# Patient Record
Sex: Female | Born: 1980 | Race: Black or African American | Hispanic: No | Marital: Single | Smoking: Former smoker
Health system: Southern US, Community
[De-identification: ages and names within clinical notes are randomized; demographics above are authoritative.]

## PROBLEM LIST (undated history)

## (undated) DIAGNOSIS — E039 Hypothyroidism, unspecified: Secondary | ICD-10-CM

## (undated) DIAGNOSIS — D649 Anemia, unspecified: Secondary | ICD-10-CM

## (undated) DIAGNOSIS — K219 Gastro-esophageal reflux disease without esophagitis: Secondary | ICD-10-CM

## (undated) DIAGNOSIS — Z21 Asymptomatic human immunodeficiency virus [HIV] infection status: Secondary | ICD-10-CM

## (undated) HISTORY — PX: FOOT SURGERY: SHX648

## (undated) HISTORY — PX: IUD REMOVAL: SHX5392

## (undated) HISTORY — PX: ECTOPIC PREGNANCY SURGERY: SHX613

---

## 2005-03-11 ENCOUNTER — Emergency Department: Payer: Self-pay | Admitting: General Practice

## 2006-06-17 ENCOUNTER — Emergency Department: Payer: Self-pay | Admitting: Emergency Medicine

## 2006-06-21 ENCOUNTER — Emergency Department: Payer: Self-pay | Admitting: Emergency Medicine

## 2006-06-22 ENCOUNTER — Emergency Department: Payer: Self-pay | Admitting: Internal Medicine

## 2006-06-24 ENCOUNTER — Emergency Department: Payer: Self-pay | Admitting: Emergency Medicine

## 2006-06-25 ENCOUNTER — Emergency Department: Payer: Self-pay | Admitting: Unknown Physician Specialty

## 2006-07-22 ENCOUNTER — Emergency Department: Payer: Self-pay | Admitting: Emergency Medicine

## 2007-09-05 ENCOUNTER — Ambulatory Visit: Payer: Self-pay | Admitting: Obstetrics and Gynecology

## 2007-09-06 ENCOUNTER — Inpatient Hospital Stay: Payer: Self-pay | Admitting: Obstetrics and Gynecology

## 2011-03-11 ENCOUNTER — Ambulatory Visit: Payer: Self-pay | Admitting: Obstetrics and Gynecology

## 2011-03-15 ENCOUNTER — Ambulatory Visit: Payer: Self-pay | Admitting: Obstetrics and Gynecology

## 2011-05-08 ENCOUNTER — Emergency Department: Payer: Self-pay | Admitting: Unknown Physician Specialty

## 2011-11-07 ENCOUNTER — Ambulatory Visit: Payer: Self-pay | Admitting: Family Medicine

## 2012-01-17 ENCOUNTER — Ambulatory Visit: Payer: Self-pay | Admitting: Family Medicine

## 2012-01-20 ENCOUNTER — Ambulatory Visit: Payer: Self-pay | Admitting: Family Medicine

## 2012-02-17 ENCOUNTER — Ambulatory Visit: Payer: Self-pay | Admitting: Family Medicine

## 2012-03-22 ENCOUNTER — Ambulatory Visit: Payer: Self-pay | Admitting: Family Medicine

## 2012-03-26 ENCOUNTER — Inpatient Hospital Stay: Payer: Self-pay

## 2012-03-26 LAB — CBC WITH DIFFERENTIAL/PLATELET
Basophil %: 0.2 %
Eosinophil #: 0 10*3/uL (ref 0.0–0.7)
HGB: 11.7 g/dL — ABNORMAL LOW (ref 12.0–16.0)
Lymphocyte #: 2.8 10*3/uL (ref 1.0–3.6)
Lymphocyte %: 28.8 %
MCV: 85 fL (ref 80–100)
Monocyte %: 11.4 %
Neutrophil #: 5.8 10*3/uL (ref 1.4–6.5)
Neutrophil %: 59.2 %
RBC: 4.36 10*6/uL (ref 3.80–5.20)
RDW: 16.5 % — ABNORMAL HIGH (ref 11.5–14.5)

## 2012-03-27 LAB — HEMATOCRIT: HCT: 32.7 % — ABNORMAL LOW (ref 35.0–47.0)

## 2013-07-28 ENCOUNTER — Emergency Department: Payer: Self-pay | Admitting: Internal Medicine

## 2013-07-28 LAB — COMPREHENSIVE METABOLIC PANEL
Albumin: 4 g/dL (ref 3.4–5.0)
Anion Gap: 8 (ref 7–16)
Bilirubin,Total: 0.4 mg/dL (ref 0.2–1.0)
Chloride: 105 mmol/L (ref 98–107)
Co2: 27 mmol/L (ref 21–32)
EGFR (African American): >60
EGFR (Non-African Amer.): >60
Glucose: 96 mg/dL (ref 65–99)
Osmolality: 278 (ref 275–301)
SGPT (ALT): 21 U/L (ref 12–78)
Sodium: 140 mmol/L (ref 136–145)
Total Protein: 8.1 g/dL (ref 6.4–8.2)

## 2013-07-28 LAB — URINALYSIS, COMPLETE
Bacteria: NONE SEEN
Glucose,UR: NEGATIVE mg/dL (ref 0–75)
Ketone: NEGATIVE
Protein: NEGATIVE
RBC,UR: 1 /HPF (ref 0–5)

## 2013-07-28 LAB — CBC
HCT: 40.3 % (ref 35.0–47.0)
HGB: 13.5 g/dL (ref 12.0–16.0)
MCHC: 33.5 g/dL (ref 32.0–36.0)
MCV: 82 fL (ref 80–100)
Platelet: 332 10*3/uL (ref 150–440)
RBC: 4.94 10*6/uL (ref 3.80–5.20)
RDW: 13.9 % (ref 11.5–14.5)
WBC: 5.8 10*3/uL (ref 3.6–11.0)

## 2013-07-28 LAB — PROTIME-INR: INR: 0.9

## 2014-02-22 ENCOUNTER — Emergency Department: Payer: Self-pay | Admitting: Internal Medicine

## 2014-03-04 ENCOUNTER — Emergency Department: Payer: Self-pay | Admitting: Emergency Medicine

## 2014-03-30 ENCOUNTER — Emergency Department: Payer: Self-pay | Admitting: Emergency Medicine

## 2014-03-30 LAB — COMPREHENSIVE METABOLIC PANEL
ALBUMIN: 4 g/dL (ref 3.4–5.0)
ALK PHOS: 74 U/L
ALT: 22 U/L (ref 12–78)
ANION GAP: 9 (ref 7–16)
BUN: 9 mg/dL (ref 7–18)
Bilirubin,Total: 0.3 mg/dL (ref 0.2–1.0)
Calcium, Total: 8.8 mg/dL (ref 8.5–10.1)
Chloride: 110 mmol/L — ABNORMAL HIGH (ref 98–107)
Co2: 21 mmol/L (ref 21–32)
Creatinine: 0.83 mg/dL (ref 0.60–1.30)
EGFR (African American): >60
EGFR (Non-African Amer.): >60
Glucose: 117 mg/dL — ABNORMAL HIGH (ref 65–99)
Osmolality: 279 (ref 275–301)
POTASSIUM: 3.7 mmol/L (ref 3.5–5.1)
SGOT(AST): 23 U/L (ref 15–37)
SODIUM: 140 mmol/L (ref 136–145)
TOTAL PROTEIN: 7.8 g/dL (ref 6.4–8.2)

## 2014-03-30 LAB — URINALYSIS, COMPLETE
Bilirubin,UR: NEGATIVE
Glucose,UR: NEGATIVE mg/dL (ref 0–75)
Ketone: NEGATIVE
NITRITE: NEGATIVE
PH: 9 (ref 4.5–8.0)
Protein: 100
RBC,UR: 9 /HPF (ref 0–5)
Specific Gravity: 1.025 (ref 1.003–1.030)
Squamous Epithelial: 15
WBC UR: 2 /HPF (ref 0–5)

## 2014-03-30 LAB — CBC
HCT: 40.7 % (ref 35.0–47.0)
HGB: 12.8 g/dL (ref 12.0–16.0)
MCH: 26.2 pg (ref 26.0–34.0)
MCHC: 31.4 g/dL — ABNORMAL LOW (ref 32.0–36.0)
MCV: 84 fL (ref 80–100)
PLATELETS: 337 10*3/uL (ref 150–440)
RBC: 4.87 10*6/uL (ref 3.80–5.20)
RDW: 15 % — AB (ref 11.5–14.5)
WBC: 5.5 10*3/uL (ref 3.6–11.0)

## 2014-03-30 LAB — LIPASE, BLOOD: Lipase: 90 U/L (ref 73–393)

## 2014-04-20 ENCOUNTER — Emergency Department: Payer: Self-pay | Admitting: Emergency Medicine

## 2014-04-20 LAB — COMPREHENSIVE METABOLIC PANEL
ALBUMIN: 4.1 g/dL (ref 3.4–5.0)
ANION GAP: 6 — AB (ref 7–16)
Alkaline Phosphatase: 72 U/L
BILIRUBIN TOTAL: 0.5 mg/dL (ref 0.2–1.0)
BUN: 10 mg/dL (ref 7–18)
CHLORIDE: 108 mmol/L — AB (ref 98–107)
Calcium, Total: 9.4 mg/dL (ref 8.5–10.1)
Co2: 25 mmol/L (ref 21–32)
Creatinine: 0.76 mg/dL (ref 0.60–1.30)
EGFR (African American): >60
Glucose: 95 mg/dL (ref 65–99)
Osmolality: 276 (ref 275–301)
Potassium: 3.8 mmol/L (ref 3.5–5.1)
SGOT(AST): 21 U/L (ref 15–37)
SGPT (ALT): 23 U/L (ref 12–78)
Sodium: 139 mmol/L (ref 136–145)
Total Protein: 8.2 g/dL (ref 6.4–8.2)

## 2014-04-20 LAB — URINALYSIS, COMPLETE
Bilirubin,UR: NEGATIVE
Blood: NEGATIVE
Glucose,UR: NEGATIVE mg/dL (ref 0–75)
KETONE: NEGATIVE
LEUKOCYTE ESTERASE: NEGATIVE
Nitrite: NEGATIVE
Ph: 8 (ref 4.5–8.0)
Protein: 30
RBC,UR: 3 /HPF (ref 0–5)
Specific Gravity: 1.021 (ref 1.003–1.030)
Squamous Epithelial: 8
WBC UR: 2 /HPF (ref 0–5)

## 2014-04-20 LAB — CBC WITH DIFFERENTIAL/PLATELET
BASOS PCT: 1.2 %
Basophil #: 0.1 10*3/uL (ref 0.0–0.1)
EOS PCT: 0.4 %
Eosinophil #: 0 10*3/uL (ref 0.0–0.7)
HCT: 39.7 % (ref 35.0–47.0)
HGB: 12.7 g/dL (ref 12.0–16.0)
LYMPHS PCT: 29.7 %
Lymphocyte #: 2.4 10*3/uL (ref 1.0–3.6)
MCH: 27.2 pg (ref 26.0–34.0)
MCHC: 32 g/dL (ref 32.0–36.0)
MCV: 85 fL (ref 80–100)
Monocyte #: 0.5 x10 3/mm (ref 0.2–0.9)
Monocyte %: 6 %
NEUTROS PCT: 62.7 %
Neutrophil #: 5.1 10*3/uL (ref 1.4–6.5)
PLATELETS: 333 10*3/uL (ref 150–440)
RBC: 4.67 10*6/uL (ref 3.80–5.20)
RDW: 14.8 % — AB (ref 11.5–14.5)
WBC: 8.1 10*3/uL (ref 3.6–11.0)

## 2014-04-20 LAB — LIPASE, BLOOD: Lipase: 88 U/L (ref 73–393)

## 2014-09-19 ENCOUNTER — Emergency Department: Payer: Self-pay | Admitting: Emergency Medicine

## 2014-09-19 LAB — CBC
HCT: 40.5 % (ref 35.0–47.0)
HGB: 12.6 g/dL (ref 12.0–16.0)
MCH: 26.6 pg (ref 26.0–34.0)
MCHC: 31 g/dL — AB (ref 32.0–36.0)
MCV: 86 fL (ref 80–100)
Platelet: 325 10*3/uL (ref 150–440)
RBC: 4.72 10*6/uL (ref 3.80–5.20)
RDW: 14.1 % (ref 11.5–14.5)
WBC: 6.1 10*3/uL (ref 3.6–11.0)

## 2014-09-19 LAB — BASIC METABOLIC PANEL
ANION GAP: 3 — AB (ref 7–16)
BUN: 8 mg/dL (ref 7–18)
CHLORIDE: 110 mmol/L — AB (ref 98–107)
CO2: 27 mmol/L (ref 21–32)
CREATININE: 0.86 mg/dL (ref 0.60–1.30)
Calcium, Total: 8.8 mg/dL (ref 8.5–10.1)
EGFR (African American): >60
Glucose: 72 mg/dL (ref 65–99)
OSMOLALITY: 276 (ref 275–301)
POTASSIUM: 4 mmol/L (ref 3.5–5.1)
Sodium: 140 mmol/L (ref 136–145)

## 2014-10-23 DIAGNOSIS — E049 Nontoxic goiter, unspecified: Secondary | ICD-10-CM | POA: Insufficient documentation

## 2014-10-23 DIAGNOSIS — I889 Nonspecific lymphadenitis, unspecified: Secondary | ICD-10-CM | POA: Insufficient documentation

## 2014-10-23 DIAGNOSIS — R9389 Abnormal findings on diagnostic imaging of other specified body structures: Secondary | ICD-10-CM | POA: Insufficient documentation

## 2014-11-03 ENCOUNTER — Ambulatory Visit: Payer: Self-pay | Admitting: Unknown Physician Specialty

## 2015-03-09 ENCOUNTER — Emergency Department: Payer: Self-pay | Admitting: Emergency Medicine

## 2015-04-12 NOTE — Op Note (Signed)
PATIENT NAME:  [name], Becky R MR#:  [id number] DATE OF BIRTH:  [birth year]  DATE OF PROCEDURE:  03/26/2012  PREOPERATIVE DIAGNOSES:  1. Intrauterine pregnancy, term.  2. Previous cesarean sections x2, desires repeat cesarean section.   POSTOPERATIVE DIAGNOSES:  1. Intrauterine pregnancy, term.  2. Previous cesarean sections x2, desires repeat cesarean section.   PROCEDURE PERFORMED: Low transverse cesarean section   SURGEON: Deloris PingPhilip J. Luella Cookosenow, MD  FIRST ASSISTANT: Farrel Connersolleen Gutierrez, CNM  NEONATOLOGIST: Dr. Ailene RavelEric Horowitz  OPERATIVE FINDINGS: 7 pound, 14 ounce female infant delivered at 16:54-Zayden.    DESCRIPTION OF PROCEDURE: After adequate conduction anesthesia, patient prepped and draped in routine fashion. A skin incision in modified Pfannenstiel fashion was made through the previous scar and carried down the various layers. Peritoneal cavity was entered. It should be noted that much scarring was encountered. Bladder flap was created although bladder really could be not pushed down. Low transverse incision made and the above-described infant was delivered without difficulty. Placenta was removed manually. Uterus then closed in a continuous lock suture of chromic 1. Several additional sutures were carried for hemostasis. Pelvis was lavaged with copious amounts of saline. Interceed was placed in the lower uterine segment. Rectus muscles reapproximated in the midline. Several additional sutures required for hemostasis. Fascia reapproximated with two continuous sutures of Maxon. It should be noted the On-Q pump was already placed. Multiple 3-0 plain sutures placed in the fat and the skin was closed with skin staples. Estimated blood loss 700 mL. Patient tolerated procedure well, left the Operating Room in good condition.     Sponge and needle counts were said to be correct at end of the procedure.   ____________________________ Deloris PingPhilip J. Luella Cookosenow, MD pjr:cms D: 03/26/2012 17:34:06  ET T: 03/27/2012 08:54:04 ET JOB#: [id number]  cc: Deloris PingPhilip J. Luella Cookosenow, MD, <Dictator> Towana BadgerPHILIP J Ravon Mortellaro MD ELECTRONICALLY SIGNED 04/10/2012 18:54

## 2015-04-13 LAB — SURGICAL PATHOLOGY

## 2015-05-25 ENCOUNTER — Encounter
Admission: RE | Admit: 2015-05-25 | Discharge: 2015-05-25 | Disposition: A | Discharge status: Home / Self Care | Payer: Medicaid Other | Source: Ambulatory Visit | Attending: Podiatry | Admitting: Podiatry

## 2015-05-25 DIAGNOSIS — M2012 Hallux valgus (acquired), left foot: Secondary | ICD-10-CM | POA: Insufficient documentation

## 2015-05-25 DIAGNOSIS — Z01812 Encounter for preprocedural laboratory examination: Secondary | ICD-10-CM | POA: Insufficient documentation

## 2015-05-25 LAB — DIFFERENTIAL
BASOS ABS: 0.1 10*3/uL (ref 0–0.1)
Basophils Relative: 1 %
EOS ABS: 0.1 10*3/uL (ref 0–0.7)
Eosinophils Relative: 2 %
LYMPHS PCT: 49 %
Lymphs Abs: 2.9 10*3/uL (ref 1.0–3.6)
Monocytes Absolute: 0.4 10*3/uL (ref 0.2–0.9)
Monocytes Relative: 7 %
Neutro Abs: 2.4 10*3/uL (ref 1.4–6.5)
Neutrophils Relative %: 41 %

## 2015-05-25 LAB — CBC
HEMATOCRIT: 35.9 % (ref 35.0–47.0)
Hemoglobin: 11.4 g/dL — ABNORMAL LOW (ref 12.0–16.0)
MCH: 27 pg (ref 26.0–34.0)
MCHC: 31.6 g/dL — AB (ref 32.0–36.0)
MCV: 85.3 fL (ref 80.0–100.0)
Platelets: 286 10*3/uL (ref 150–440)
RBC: 4.21 MIL/uL (ref 3.80–5.20)
RDW: 14.3 % (ref 11.5–14.5)
WBC: 6 10*3/uL (ref 3.6–11.0)

## 2015-05-25 NOTE — Patient Instructions (Signed)
  Your procedure is scheduled on:May 29, 2015 (Friday)Report to Same Day Surgery. To find out your arrival time please call [phone] between 1PM - 3PM on June 9,2016 Remember: Instructions that are not followed completely may result in serious medical risk, up to and including death, or upon the discretion of your surgeon and anesthesiologist your surgery may need to be rescheduled.    __x__ 1. Do not eat food or drink liquids after midnight. No gum chewing or hard candies.     __x__ 2. No Alcohol for 24 hours before or after surgery.   ____ 3. Bring all medications with you on the day of surgery if instructed.    __x__ 4. Notify your doctor if there is any change in your medical condition     (cold, fever, infections).     Do not wear jewelry, make-up, hairpins, clips or nail polish.  Do not wear lotions, powders, or perfumes. You may wear deodorant.  Do not shave 48 hours prior to surgery. Men may shave face and neck.  Do not bring valuables to the hospital.    [facility] is not [facility] for any belongings or valuables.               Contacts, dentures or bridgework may not be worn into surgery.  Leave your suitcase in the car. After surgery it may be brought to your room.  For patients admitted to the hospital, discharge time is determined by your                treatment team.   Patients discharged the day of surgery will not be allowed to drive home.   Please read over the following fact sheets that you were given:   Surgical Site Infection Prevention   ____ Take these medicines the morning of surgery with A SIP OF WATER:    1.   2.   3.   4.  5.  6.  ____ Fleet Enema (as directed)   __x__ Use CHG Soap as directed  ____ Use inhalers on the day of surgery  ____ Stop metformin 2 days prior to surgery    ____ Take 1/2 of usual insulin dose the night before surgery and none on the morning of surgery.   ____ Stop Coumadin/Plavix/aspirin on   ____ Stop  Anti-inflammatories on    ____ Stop supplements until after surgery.    ____ Bring C-Pap to the hospital.

## 2015-05-25 NOTE — Pre-Procedure Instructions (Signed)
  Your procedure is scheduled on: May 29, 2015 (Friday) Report to Same Day Surgery. To find out your arrival time please call [phone] between 1PM - 3PM on May 28, 2015 (Thursday).  Remember: Instructions that are not followed completely may result in serious medical risk, up to and including death, or upon the discretion of your surgeon and anesthesiologist your surgery may need to be rescheduled.    __x__ 1. Do not eat food or drink liquids after midnight. No gum chewing or hard candies.     __x__ 2. No Alcohol for 24 hours before or after surgery.   ____ 3. Bring all medications with you on the day of surgery if instructed.    __x__ 4. Notify your doctor if there is any change in your medical condition     (cold, fever, infections).     Do not wear jewelry, make-up, hairpins, clips or nail polish.  Do not wear lotions, powders, or perfumes. You may wear deodorant.  Do not shave 48 hours prior to surgery. Men may shave face and neck.  Do not bring valuables to the hospital.    [facility] is not responsible for any belongings or valuables.               Contacts, dentures or bridgework may not be worn into surgery.  Leave your suitcase in the car. After surgery it may be brought to your room.  For patients admitted to the hospital, discharge time is determined by your                treatment team.   Patients discharged the day of surgery will not be allowed to drive home.   Please read over the following fact sheets that you were given:   Surgical Site Infection Prevention   ____ Take these medicines the morning of surgery with A SIP OF WATER:    1.          ____ Fleet Enema (as directed)   __x__ Use CHG Soap as directed  ____ Use inhalers on the day of surgery  ____ Stop metformin 2 days prior to surgery    ____ Take 1/2 of usual insulin dose the night before surgery and none on the morning of surgery.   ____ Stop Coumadin/Plavix/aspirin on   ____ Stop  Anti-inflammatories on    ____ Stop supplements until after surgery.    ____ Bring C-Pap to the hospital.

## 2015-05-29 ENCOUNTER — Ambulatory Visit: Payer: Medicaid Other | Admitting: Registered Nurse

## 2015-05-29 ENCOUNTER — Ambulatory Visit
Admission: RE | Admit: 2015-05-29 | Discharge: 2015-05-29 | Disposition: A | Discharge status: Home / Self Care | Payer: Medicaid Other | Source: Ambulatory Visit | Attending: Podiatry | Admitting: Podiatry

## 2015-05-29 ENCOUNTER — Encounter
Admission: RE | Disposition: A | Discharge status: Home / Self Care | Payer: Self-pay | Source: Ambulatory Visit | Attending: Podiatry

## 2015-05-29 DIAGNOSIS — M2012 Hallux valgus (acquired), left foot: Secondary | ICD-10-CM | POA: Insufficient documentation

## 2015-05-29 DIAGNOSIS — E049 Nontoxic goiter, unspecified: Secondary | ICD-10-CM | POA: Insufficient documentation

## 2015-05-29 DIAGNOSIS — Z79899 Other long term (current) drug therapy: Secondary | ICD-10-CM | POA: Diagnosis not present

## 2015-05-29 HISTORY — PX: HALLUX VALGUS AUSTIN: SHX6623

## 2015-05-29 SURGERY — CORRECTION, HALLUX VALGUS
Anesthesia: Monitor Anesthesia Care | Laterality: Left | Wound class: Clean

## 2015-05-29 MED ORDER — FAMOTIDINE 20 MG PO TABS
20.0000 mg | ORAL_TABLET | Freq: Once | ORAL | Status: AC
  Administered 2015-05-29: 20 mg via ORAL

## 2015-05-29 MED ORDER — LACTATED RINGERS IV SOLN
INTRAVENOUS | Status: DC
  Administered 2015-05-29: 06:00:00 via INTRAVENOUS

## 2015-05-29 MED ORDER — MIDAZOLAM HCL 2 MG/2ML IJ SOLN
INTRAMUSCULAR | Status: DC | PRN
  Administered 2015-05-29: 2 mg via INTRAVENOUS

## 2015-05-29 MED ORDER — NEOMYCIN-POLYMYXIN B GU 40-200000 IR SOLN
Status: AC
  Filled 2015-05-29: qty 2

## 2015-05-29 MED ORDER — BUPIVACAINE HCL (PF) 0.5 % IJ SOLN
INTRAMUSCULAR | Status: DC | PRN
  Administered 2015-05-29: 10 mL

## 2015-05-29 MED ORDER — LEVOTHYROXINE SODIUM 25 MCG PO TABS
25.0000 ug | ORAL_TABLET | Freq: Every day | ORAL | Status: DC
  Filled 2015-05-29: qty 1

## 2015-05-29 MED ORDER — ADULT MULTIVITAMIN W/MINERALS CH
1.0000 | ORAL_TABLET | Freq: Every day | ORAL | Status: DC
  Filled 2015-05-29 (×2): qty 1

## 2015-05-29 MED ORDER — CEFAZOLIN SODIUM-DEXTROSE 2-3 GM-% IV SOLR
INTRAVENOUS | Status: DC | PRN
  Administered 2015-05-29: 2 g via INTRAVENOUS

## 2015-05-29 MED ORDER — ONDANSETRON HCL 4 MG/2ML IJ SOLN
4.0000 mg | Freq: Once | INTRAMUSCULAR | Status: DC | PRN
Start: 2015-05-29 — End: 2015-05-29

## 2015-05-29 MED ORDER — CEFAZOLIN SODIUM-DEXTROSE 2-3 GM-% IV SOLR
INTRAVENOUS | Status: AC
  Filled 2015-05-29: qty 50

## 2015-05-29 MED ORDER — BUPIVACAINE HCL (PF) 0.5 % IJ SOLN
INTRAMUSCULAR | Status: AC
  Filled 2015-05-29: qty 30

## 2015-05-29 MED ORDER — CEFAZOLIN SODIUM-DEXTROSE 2-3 GM-% IV SOLR
2.0000 g | Freq: Once | INTRAVENOUS | Status: AC
  Administered 2015-05-29: 2 g via INTRAVENOUS

## 2015-05-29 MED ORDER — FENTANYL CITRATE (PF) 100 MCG/2ML IJ SOLN
25.0000 ug | INTRAMUSCULAR | Status: DC | PRN
Start: 2015-05-29 — End: 2015-05-29

## 2015-05-29 MED ORDER — PROPOFOL INFUSION 10 MG/ML OPTIME
INTRAVENOUS | Status: DC | PRN
  Administered 2015-05-29: 75 ug/kg/min via INTRAVENOUS

## 2015-05-29 MED ORDER — FENTANYL CITRATE (PF) 100 MCG/2ML IJ SOLN
INTRAMUSCULAR | Status: DC | PRN
  Administered 2015-05-29 (×2): 50 ug via INTRAVENOUS

## 2015-05-29 MED ORDER — LIDOCAINE HCL (PF) 1 % IJ SOLN
INTRAMUSCULAR | Status: AC
  Filled 2015-05-29: qty 30

## 2015-05-29 MED ORDER — LIDOCAINE HCL (PF) 1 % IJ SOLN
INTRAMUSCULAR | Status: AC
  Administered 2015-05-29: 0.25 mL
  Filled 2015-05-29: qty 2

## 2015-05-29 MED ORDER — OXYCODONE-ACETAMINOPHEN 5-325 MG PO TABS: 1.0000 | ORAL_TABLET | ORAL | Status: DC | PRN

## 2015-05-29 MED ORDER — FAMOTIDINE 20 MG PO TABS
ORAL_TABLET | ORAL | Status: AC
  Filled 2015-05-29: qty 1

## 2015-05-29 SURGICAL SUPPLY — 59 items
BAG COUNTER SPONGE EZ (MISCELLANEOUS) IMPLANT
BANDAGE CONFORM 2X5YD N/S (GAUZE/BANDAGES/DRESSINGS) ×3 IMPLANT
BANDAGE ELASTIC 4 CLIP NS LF (GAUZE/BANDAGES/DRESSINGS) ×3 IMPLANT
BANDAGE STRETCH 3X4.1 STRL (GAUZE/BANDAGES/DRESSINGS) ×6 IMPLANT
BEAVER BLADE ×3 IMPLANT
BENZOIN TINCTURE PRP APPL 2/3 (GAUZE/BANDAGES/DRESSINGS) ×3 IMPLANT
BIT DRILL CANN 3.0 (BIT) ×3 IMPLANT
BIT DRILL TWST CANN 2.2X1.87MM (DRILL) ×2 IMPLANT
BLADE MED AGGRESSIVE (BLADE) ×3 IMPLANT
BLADE OSC/SAGITTAL MD 5.5X18 (BLADE) IMPLANT
BLADE SURG 15 STRL LF DISP TIS (BLADE) ×2 IMPLANT
BLADE SURG 15 STRL SS (BLADE) ×4
BNDG ESMARK 4X12 TAN STRL LF (GAUZE/BANDAGES/DRESSINGS) ×3 IMPLANT
BNDG STRETCH 4X75 STRL LF (GAUZE/BANDAGES/DRESSINGS) ×3 IMPLANT
CANISTER SUCT 1200ML W/VALVE (MISCELLANEOUS) ×3 IMPLANT
CLOSURE WOUND 1/4X4 (GAUZE/BANDAGES/DRESSINGS) ×1
COUNTER SPONGE BAG EZ (MISCELLANEOUS)
CUFF TOURN 18 STER (MISCELLANEOUS) IMPLANT
CUFF TOURN DUAL PL 12 NO SLV (MISCELLANEOUS) ×3 IMPLANT
DRAPE FLUOR MINI C-ARM 54X84 (DRAPES) ×3 IMPLANT
DRILL TWIST CANN 2.2X1.87MM (DRILL) ×6
DRSG TELFA 3X8 NADH (GAUZE/BANDAGES/DRESSINGS) ×3 IMPLANT
DURAPREP 26ML APPLICATOR (WOUND CARE) ×3 IMPLANT
GAUZE PETRO XEROFOAM 1X8 (MISCELLANEOUS) ×3 IMPLANT
GAUZE SPONGE 4X4 12PLY STRL (GAUZE/BANDAGES/DRESSINGS) ×3 IMPLANT
GLOVE BIO SURGEON STRL SZ7.5 (GLOVE) ×9 IMPLANT
GLOVE INDICATOR 8.0 STRL GRN (GLOVE) ×3 IMPLANT
GOWN STRL REUS W/ TWL LRG LVL3 (GOWN DISPOSABLE) ×2 IMPLANT
GOWN STRL REUS W/TWL LRG LVL3 (GOWN DISPOSABLE) ×4
K-WIRE TROCAR .8X100 (WIRE) ×3 IMPLANT
LABEL OR SOLS (LABEL) ×3 IMPLANT
MEDARTIS ×7 IMPLANT
NDL SAFETY 25GX1.5 (NEEDLE) ×3 IMPLANT
NEEDLE FILTER BLUNT 18X 1/2SAF (NEEDLE) ×2
NEEDLE FILTER BLUNT 18X1 1/2 (NEEDLE) ×1 IMPLANT
NS IRRIG 500ML POUR BTL (IV SOLUTION) ×3 IMPLANT
PACK EXTREMITY ARMC (MISCELLANEOUS) ×3 IMPLANT
PAD CAST CTTN 4X4 STRL (SOFTGOODS) ×1 IMPLANT
PAD GROUND ADULT SPLIT (MISCELLANEOUS) ×3 IMPLANT
PADDING CAST COTTON 4X4 STRL (SOFTGOODS) ×2
PENCIL ELECTRO HAND CTR (MISCELLANEOUS) ×3 IMPLANT
RASP SM TEAR CROSS CUT (RASP) ×3 IMPLANT
SCREW CANN 2.2X17 (Screw) ×3 IMPLANT
SOL PREP PVP 2OZ (MISCELLANEOUS) ×3
SOLUTION PREP PVP 2OZ (MISCELLANEOUS) ×1 IMPLANT
SPLINT FAST PLASTER 5X30 (CAST SUPPLIES) ×2
SPLINT PLASTER CAST FAST 5X30 (CAST SUPPLIES) ×1 IMPLANT
STOCKINETTE STRL 4IN 9604848 (GAUZE/BANDAGES/DRESSINGS) ×3 IMPLANT
STOCKINETTE STRL 6IN 960660 (GAUZE/BANDAGES/DRESSINGS) ×3 IMPLANT
STRAP SAFETY BODY (MISCELLANEOUS) ×3 IMPLANT
STRIP CLOSURE SKIN 1/4X4 (GAUZE/BANDAGES/DRESSINGS) ×2 IMPLANT
SUT ETHILON 5-0 FS-2 18 BLK (SUTURE) ×3 IMPLANT
SUT VIC AB 4-0 FS2 27 (SUTURE) ×3 IMPLANT
SWABSTK COMLB BENZOIN TINCTURE (MISCELLANEOUS) ×3 IMPLANT
SYRINGE 10CC LL (SYRINGE) ×3 IMPLANT
WIRE K 1.1 (MISCELLANEOUS) ×3 IMPLANT
WIRE Z .045 C-WIRE SPADE TIP (WIRE) IMPLANT
WIRE Z .062 C-WIRE SPADE TIP (WIRE) IMPLANT
k-wire ×6 IMPLANT

## 2015-05-29 NOTE — Anesthesia Postprocedure Evaluation (Signed)
  Anesthesia Post-op Note  Patient: Becky Manning  Procedure(s) Performed: Procedure(s): Hallux valgus correction, left foot  (Left)  Anesthesia type:MAC  Patient location: PACU  Post pain: Pain level controlled  Post assessment: Post-op Vital signs reviewed, Patient's Cardiovascular Status Stable, Respiratory Function Stable, Patent Airway and No signs of Nausea or vomiting  Post vital signs: Reviewed and stable  Last Vitals:  Filed Vitals:   05/29/15 0928  BP: 126/78  Pulse: 49  Temp: 37.2 C  Resp: 14    Level of consciousness: awake, alert  and patient cooperative  Complications: No apparent anesthesia complications

## 2015-05-29 NOTE — Transfer of Care (Signed)
Immediate Anesthesia Transfer of Care Note  Patient: Becky Manning  Procedure(s) Performed: Procedure(s): Hallux valgus correction, left foot  (Left)  Patient Location: PACU    Anesthesia Type:General   Level of Consciousness: awake, alert  and oriented  Airway & Oxygen Therapy: Patient Spontanous Breathing and Patient connected to face mask oxygen  Post-op Assessment: Report given to RN, Post -op Vital signs reviewed and stable and Patient moving all extremities X 4   Post vital signs: Reviewed and stable  Last Vitals:  Filed Vitals:   05/29/15 0859  BP: 117/87  Pulse: 52  Temp: 36.9 C  Resp: 16    Complications: No apparent anesthesia complications

## 2015-05-29 NOTE — Discharge Instructions (Signed)
1. Elevate the left leg.  2. Keep the bandage clean, dry, and do not remove.  3. Sponge bathe only left leg.  4. Wear surgical shoe over the splint whenever walking or standing.  5. Take one pain pill, oxycodone, every 4 hours as needed for pain

## 2015-05-29 NOTE — Op Note (Signed)
Date of operation: 05/29/2015  Surgeon: Ricci Barker DPM  Preoperative diagnosis: Hallux valgus deformity left foot  Postoperative diagnosis: Same  Procedure: Austin-type hallux valgus correction with screw fixation left foot  Anesthesia: Local Mac  Hemostasis: Pneumatic tourniquet left ankle 250 mmHg.  Estimated blood loss: Minimal  Materials: One 2.2 mm Medartis screw, length 17 mm.  Pathology: None  Complications: None apparent.  Operative indications: This is a 34 year old female with a history of a painful hallux valgus deformity on her left foot. She elects for surgical correction of the deformity.  Operative procedure: Patient was taken to the operating room and placed on the table in the supine position. Following satisfactory sedation the left foot was anesthetized with 10 cc of 0.5% bupivacaine plain around the first metatarsal. A pneumatic tourniquet was applied at the level of the left ankle and the foot was prepped and draped in the usual sterile fashion. The foot was exsanguinated and the tourniquet was inflated to 250 mmHg.    Attention was then directed to the dorsomedial aspect of the left foot where an approximate 5 cm linear incision was made coursing proximal to distal over the first metatarsal and metatarsal phalangeal joint. The incision was deepened via sharp and blunt dissection with care taken to coagulate all bleeders and retract neurovascular structures. At the level of the capsule a linear capsular incision was made and the capsular and periosteal tissues reflected off of the medial and dorsal head of the first metatarsal. A medial prominence was noted and this was resected using a pneumatic saw as well as the dorsal prominence which was resected. Next a K wire was driven from medial to lateral as an axis guide and a V-shaped osteotomy was performed through the head of the first metatarsal and the capital fragment was freely mobilized. Attention was then directed  to the first interspace where the incision was deepened down to the level of the transverse intermetatarsal ligament which was incised. A lateral capsulotomy with freeing of the sesamoid apparatus was performed with the release of the abductor tendon. Attention was then directed back to the medial aspect of the incision where the capital fragment was mobilized laterally and fixated using a K wire from the Medartis screw set. Good reduction of the deformity was noted with wire placement and then a 17 mm screw was then inserted using standard technique. An intraoperative FluoroScan views revealed good correction of the deformity and alignment of the osteotomy. The medial eminence was then resected and the edges were rasped smooth. Wound was flushed with copious amounts of sterile saline and closed in layers using a 40 Vicryls running suture for all layers from capsular and periosteal tissue to deep and superficial subcutaneous to skin closure. Tincture of benzoin and Steri-Strips applied to the incision followed by a sterile bandage. Tourniquet was released and blood flow noted to return immediately to the left foot and all digits. A posterior splint was applied to the left lower extremity with the foot 90 relative to the leg. She tolerated the procedure and anesthesia well and was transported to the PACU with vital signs stable and in good condition.

## 2015-05-29 NOTE — Anesthesia Preprocedure Evaluation (Signed)
Anesthesia Evaluation  Patient identified by MRN, date of birth, ID band Patient awake    Reviewed: Allergy & Precautions, NPO status , Patient's Chart, lab work & pertinent test results  Airway Mallampati: I  TM Distance: >3 FB Neck ROM: Full    Dental  (+) Teeth Intact   Pulmonary former smoker,    Pulmonary exam normal       Cardiovascular Exercise Tolerance: Good Normal cardiovascular exam    Neuro/Psych    GI/Hepatic   Endo/Other    Renal/GU      Musculoskeletal   Abdominal Normal abdominal exam  (+)   Peds  Hematology   Anesthesia Other Findings   Reproductive/Obstetrics                             Anesthesia Physical Anesthesia Plan  ASA: I  Anesthesia Plan: MAC   Post-op Pain Management:    Induction:   Airway Management Planned: Simple Face Mask  Additional Equipment:   Intra-op Plan:   Post-operative Plan:   Informed Consent: I have reviewed the patients History and Physical, chart, labs and discussed the procedure including the risks, benefits and alternatives for the proposed anesthesia with the patient or authorized representative who has indicated his/her understanding and acceptance.     Plan Discussed with: CRNA  Anesthesia Plan Comments:         Anesthesia Quick Evaluation

## 2015-05-29 NOTE — H&P (Signed)
  H&P was reviewed in the chart. No interval change. Patient stable for surgery

## 2015-06-26 ENCOUNTER — Other Ambulatory Visit: Payer: Self-pay

## 2015-06-26 ENCOUNTER — Encounter: Payer: Self-pay | Admitting: Emergency Medicine

## 2015-06-26 ENCOUNTER — Emergency Department
Admission: EM | Admit: 2015-06-26 | Discharge: 2015-06-26 | Disposition: A | Discharge status: Home / Self Care | Payer: Medicaid Other | Attending: Emergency Medicine | Admitting: Emergency Medicine

## 2015-06-26 DIAGNOSIS — Z79899 Other long term (current) drug therapy: Secondary | ICD-10-CM | POA: Diagnosis not present

## 2015-06-26 DIAGNOSIS — R1013 Epigastric pain: Secondary | ICD-10-CM | POA: Diagnosis present

## 2015-06-26 DIAGNOSIS — Z87891 Personal history of nicotine dependence: Secondary | ICD-10-CM | POA: Diagnosis not present

## 2015-06-26 DIAGNOSIS — Z3202 Encounter for pregnancy test, result negative: Secondary | ICD-10-CM | POA: Insufficient documentation

## 2015-06-26 DIAGNOSIS — K2970 Gastritis, unspecified, without bleeding: Secondary | ICD-10-CM

## 2015-06-26 LAB — CBC WITH DIFFERENTIAL/PLATELET
BASOS ABS: 0.1 10*3/uL (ref 0–0.1)
Basophils Relative: 1 %
Eosinophils Absolute: 0.1 10*3/uL (ref 0–0.7)
Eosinophils Relative: 1 %
HCT: 38.2 % (ref 35.0–47.0)
Hemoglobin: 12.7 g/dL (ref 12.0–16.0)
LYMPHS PCT: 36 %
Lymphs Abs: 2.1 10*3/uL (ref 1.0–3.6)
MCH: 27.6 pg (ref 26.0–34.0)
MCHC: 33.2 g/dL (ref 32.0–36.0)
MCV: 83.2 fL (ref 80.0–100.0)
MONOS PCT: 6 %
Monocytes Absolute: 0.4 10*3/uL (ref 0.2–0.9)
Neutro Abs: 3.2 10*3/uL (ref 1.4–6.5)
Neutrophils Relative %: 56 %
Platelets: 381 10*3/uL (ref 150–440)
RBC: 4.6 MIL/uL (ref 3.80–5.20)
RDW: 13.9 % (ref 11.5–14.5)
WBC: 5.8 10*3/uL (ref 3.6–11.0)

## 2015-06-26 LAB — COMPREHENSIVE METABOLIC PANEL
ALBUMIN: 3.8 g/dL (ref 3.5–5.0)
ALT: 13 U/L — ABNORMAL LOW (ref 14–54)
ANION GAP: 6 (ref 5–15)
AST: 17 U/L (ref 15–41)
Alkaline Phosphatase: 62 U/L (ref 38–126)
BUN: 10 mg/dL (ref 6–20)
CHLORIDE: 108 mmol/L (ref 101–111)
CO2: 25 mmol/L (ref 22–32)
CREATININE: 0.88 mg/dL (ref 0.44–1.00)
Calcium: 8.8 mg/dL — ABNORMAL LOW (ref 8.9–10.3)
GFR calc Af Amer: >60 mL/min (ref >60)
GFR calc non Af Amer: >60 mL/min (ref >60)
Glucose, Bld: 104 mg/dL — ABNORMAL HIGH (ref 65–99)
Potassium: 3.6 mmol/L (ref 3.5–5.1)
SODIUM: 139 mmol/L (ref 135–145)
TOTAL PROTEIN: 6.7 g/dL (ref 6.5–8.1)
Total Bilirubin: 0.5 mg/dL (ref 0.3–1.2)

## 2015-06-26 LAB — URINALYSIS COMPLETE WITH MICROSCOPIC (ARMC ONLY)
Bilirubin Urine: NEGATIVE
Glucose, UA: NEGATIVE mg/dL
Hgb urine dipstick: NEGATIVE
Ketones, ur: NEGATIVE mg/dL
Leukocytes, UA: NEGATIVE
Nitrite: NEGATIVE
PH: 7 (ref 5.0–8.0)
PROTEIN: NEGATIVE mg/dL
SPECIFIC GRAVITY, URINE: 1.018 (ref 1.005–1.030)

## 2015-06-26 LAB — LIPASE, BLOOD: Lipase: 32 U/L (ref 22–51)

## 2015-06-26 LAB — TROPONIN I

## 2015-06-26 LAB — POCT PREGNANCY, URINE: Preg Test, Ur: NEGATIVE

## 2015-06-26 MED ORDER — PROMETHAZINE HCL 25 MG/ML IJ SOLN
25.0000 mg | Freq: Once | INTRAMUSCULAR | Status: AC
  Administered 2015-06-26: 25 mg via INTRAVENOUS
  Filled 2015-06-26: qty 1

## 2015-06-26 MED ORDER — PANTOPRAZOLE SODIUM 40 MG PO TBEC: 40.0000 mg | DELAYED_RELEASE_TABLET | Freq: Every day | ORAL | Status: DC

## 2015-06-26 MED ORDER — TRAMADOL HCL 50 MG PO TABS: 50.0000 mg | ORAL_TABLET | Freq: Four times a day (QID) | ORAL | Status: AC | PRN

## 2015-06-26 MED ORDER — GI COCKTAIL ~~LOC~~
30.0000 mL | Freq: Once | ORAL | Status: AC
  Administered 2015-06-26: 30 mL via ORAL
  Filled 2015-06-26: qty 30

## 2015-06-26 MED ORDER — SODIUM CHLORIDE 0.9 % IV BOLUS (SEPSIS)
1000.0000 mL | Freq: Once | INTRAVENOUS | Status: AC
  Administered 2015-06-26: 1000 mL via INTRAVENOUS

## 2015-06-26 NOTE — ED Provider Notes (Signed)
[facility] Emergency Department Provider Note  Time seen: 12:18 PM  I have reviewed the triage vital signs and the nursing notes.   HISTORY  Chief Complaint Abdominal Pain    HPI Becky Manning is a 34 y.o. female who presents the emergency department with 4 days of epigastric burning, and spitting up frothy. According to the patient for the past 4 days she has felt somewhat nauseated, has had occasional loose stool. She states she has been spitting up a frothy-type spit. She states it feels somewhat like gastric reflux, but has not been improved with Tums.  Describes her discomfort as mild, her nausea as moderate. Has not noticed any modifying factors. Patient denies any black or bloody stool or vomit, dysuria, or fever     No past medical history on file.  There are no active problems to display for this patient.   Past Surgical History  Procedure Laterality Date  . Cesarean section N/A     X 3  . Ectopic pregnancy surgery Left   . Hallux valgus austin Left 05/29/2015    Procedure: Hallux valgus correction, left foot ;  Surgeon: Linus Galas, MD;  Location: [facility] ORS;  Service: Podiatry;  Laterality: Left;    Current Outpatient Rx  Name  Route  Sig  Dispense  Refill  . levothyroxine (SYNTHROID, LEVOTHROID) 25 MCG tablet   Oral   Take 25 mcg by mouth daily before breakfast.         . Multiple Vitamin (MULTIVITAMIN WITH MINERALS) TABS tablet   Oral   Take 1 tablet by mouth daily.         Marland Kitchen oxyCODONE-acetaminophen (ROXICET) 5-325 MG per tablet   Oral   Take 1-2 tablets by mouth every 4 (four) hours as needed for moderate pain or severe pain.   30 tablet   0     Allergies Review of patient's allergies indicates no known allergies.  Family History  Problem Relation Age of Onset  . Hypertension Mother   . Hypercholesterolemia Mother     Social History History  Substance Use Topics  . Smoking status: Former Smoker -- 0.25 packs/day    Types: Cigarettes    Quit date: 05/19/2012  . Smokeless tobacco: Never Used  . Alcohol Use: Yes    Review of Systems Constitutional: Negative for fever. Cardiovascular: Negative for chest pain. Respiratory: Negative for shortness of breath. Gastrointestinal: Mild epigastric discomfort. Positive nausea. Negative for diarrhea. Genitourinary: Negative for dysuria. Musculoskeletal: Negative for back pain. 10-point ROS otherwise negative.  ____________________________________________   PHYSICAL EXAM:  VITAL SIGNS: ED Triage Vitals  Enc Vitals Group     BP 06/26/15 1137 156/93 mmHg     Pulse Rate 06/26/15 1137 71     Resp 06/26/15 1137 20     Temp 06/26/15 1137 98.4 F (36.9 C)     Temp Source 06/26/15 1137 Oral     SpO2 06/26/15 1137 100 %     Weight 06/26/15 1137 170 lb (77.111 kg)     Height 06/26/15 1137  (1.676 m)     Head Cir --      Peak Flow --      Pain Score 06/26/15 1142 4     Pain Loc --      Pain Edu? --      Excl. in GC? --     Constitutional: Alert and oriented. Well appearing and in no distress. ENT   Mouth/Throat: Mucous membranes are moist.  Cardiovascular: Normal rate, regular rhythm. No murmur Respiratory: Normal respiratory effort without tachypnea nor retractions. Breath sounds are clear and equal bilaterally. No wheezes/rales/rhonchi. Gastrointestinal: Soft and nontender. No distention.  Benign abdominal exam. Musculoskeletal: Nontender with normal range of motion in all extremities.  Neurologic:  Normal speech and language. No gross focal neurologic deficits  Skin:  Skin is warm, dry and intact.  Psychiatric: Mood and affect are normal. Speech and behavior are normal. ____________________________________________    EKG  EKG reviewed and interpreted by myself shows sinus bradycardia at 54 bpm, narrow QRS, normal axis, normal intervals, no ST changes noted.  ____________________________________________   INITIAL IMPRESSION /  ASSESSMENT AND PLAN / ED COURSE  Pertinent labs & imaging results that were available during my care of the patient were reviewed by me and considered in my medical decision making (see chart for details).  Patient presents with nausea, spitting up, mild epigastric discomfort. She states she has had a similar episode in the past diagnosed as gastritis. She does note she drank alcohol on July 4 which is when her symptoms started. Denies daily alcohol use.  Patient states complete resolution of discomfort and nausea following GI cocktail and Phenergan. Patient has seen Dr. Mechele CollinElliott in the past, she will follow-up with him for presumed gastritis. We'll discharge the patient on Protonix, Ultram, and Maalox as needed. I discussed this with the patient as well as strict return precautions to which the patient is agreeable.  ____________________________________________   FINAL CLINICAL IMPRESSION(S) / ED DIAGNOSES  Nausea Epigastric pain Gastritis   [name], MD 06/26/15 1422

## 2015-06-26 NOTE — Discharge Instructions (Signed)
Gastritis, Adult °Gastritis is soreness and puffiness (inflammation) of the lining of the stomach. If you do not get help, gastritis can cause bleeding and sores (ulcers) in the stomach. °HOME CARE  °· Only take medicine as told by your doctor. °· If you were given antibiotic medicines, take them as told. Finish the medicines even if you start to feel better. °· Drink enough fluids to keep your pee (urine) clear or pale yellow. °· Avoid foods and drinks that make your problems worse. Foods you may want to avoid include: °¨ Caffeine or alcohol. °¨ Chocolate. °¨ Mint. °¨ Garlic and onions. °¨ Spicy foods. °¨ Citrus fruits, including oranges, lemons, or limes. °¨ Food containing tomatoes, including sauce, chili, salsa, and pizza. °¨ Fried and fatty foods. °· Eat small meals throughout the day instead of large meals. °GET HELP RIGHT AWAY IF:  °· You have black or dark red poop (stools). °· You throw up (vomit) blood. It may look like coffee grounds. °· You cannot keep fluids down. °· Your belly (abdominal) pain gets worse. °· You have a fever. °· You do not feel better after 1 week. °· You have any other questions or concerns. °MAKE SURE YOU:  °· Understand these instructions. °· Will watch your condition. °· Will get help right away if you are not doing well or get worse. °Document Released: 05/23/2008 Document Revised: 02/27/2012 Document Reviewed: 01/18/2012 °Exit[employer]® Patient Information ©2015 [employer]. This information is not intended to replace advice given to you by your health care provider. Make sure you discuss any questions you have with your health care provider. ° °

## 2015-06-26 NOTE — ED Notes (Signed)
Pt states that she hasn't been feeling well since wed, states that she has been spitting up a foamy type of vomit, states that she feels like something is sitting at the top of her stomach

## 2015-06-26 NOTE — ED Notes (Signed)
Pt informed to return if any life threatening symptoms occur.  

## 2015-09-08 ENCOUNTER — Encounter: Payer: Self-pay | Admitting: *Deleted

## 2015-09-08 ENCOUNTER — Other Ambulatory Visit: Payer: Medicaid Other

## 2015-09-10 NOTE — Patient Instructions (Signed)
  Your procedure is scheduled on: 09-11-15 Report to [facility] SAME [facility] 2ND FLOOR To find out your arrival time please call [phone] between 1PM - 3PM on 09-10-15  Remember: Instructions that are not followed completely may result in serious medical risk, up to and including death, or upon the discretion of your surgeon and anesthesiologist your surgery may need to be rescheduled.    _X___ 1. Do not eat food or drink liquids after midnight. No gum chewing or hard candies.     _X___ 2. No Alcohol for 24 hours before or after surgery.   ____ 3. Bring all medications with you on the day of surgery if instructed.    ____ 4. Notify your doctor if there is any change in your medical condition     (cold, fever, infections).     Do not wear jewelry, make-up, hairpins, clips or nail polish.  Do not wear lotions, powders, or perfumes. You may wear deodorant.  Do not shave 48 hours prior to surgery. Men may shave face and neck.  Do not bring valuables to the hospital.    [facility] is not responsible for any belongings or valuables.               Contacts, dentures or bridgework may not be worn into surgery.  Leave your suitcase in the car. After surgery it may be brought to your room.  For patients admitted to the hospital, discharge time is determined by your  treatment team.   Patients discharged the day of surgery will not be allowed to drive home.   Please read over the following fact sheets that you were given:     ____ Take these medicines the morning of surgery with A SIP OF WATER:    1.LEVOTHYROXINE  2. PROTONIX  3. TAKE A PROTONIX Thursday NIGHT  4.  5.  6.  ____ Fleet Enema (as directed)   ____ Use CHG Soap as directed  ____ Use inhalers on the day of surgery  ____ Stop metformin 2 days prior to surgery    ____ Take 1/2 of usual insulin dose the night before surgery and none on the morning of surgery.   ____ Stop Coumadin/Plavix/aspirin-N/A  ____  Stop Anti-inflammatories-NO NSAIDS OR ASA PRODUCTS-TYLENOL OK   ____ Stop supplements until after surgery.    ____ Bring C-Pap to the hospital.

## 2015-09-11 ENCOUNTER — Ambulatory Visit: Payer: Medicaid Other | Admitting: Anesthesiology

## 2015-09-11 ENCOUNTER — Encounter
Admission: RE | Disposition: A | Discharge status: Home / Self Care | Payer: Self-pay | Source: Ambulatory Visit | Attending: Podiatry

## 2015-09-11 ENCOUNTER — Ambulatory Visit
Admission: RE | Admit: 2015-09-11 | Discharge: 2015-09-11 | Disposition: A | Discharge status: Home / Self Care | Payer: Medicaid Other | Source: Ambulatory Visit | Attending: Podiatry | Admitting: Podiatry

## 2015-09-11 DIAGNOSIS — Z8489 Family history of other specified conditions: Secondary | ICD-10-CM | POA: Insufficient documentation

## 2015-09-11 DIAGNOSIS — Z8249 Family history of ischemic heart disease and other diseases of the circulatory system: Secondary | ICD-10-CM | POA: Insufficient documentation

## 2015-09-11 DIAGNOSIS — Z9889 Other specified postprocedural states: Secondary | ICD-10-CM | POA: Insufficient documentation

## 2015-09-11 DIAGNOSIS — Z79899 Other long term (current) drug therapy: Secondary | ICD-10-CM | POA: Diagnosis not present

## 2015-09-11 DIAGNOSIS — M2011 Hallux valgus (acquired), right foot: Secondary | ICD-10-CM | POA: Diagnosis present

## 2015-09-11 HISTORY — DX: Hypothyroidism, unspecified: E03.9

## 2015-09-11 HISTORY — PX: HALLUX VALGUS AUSTIN: SHX6623

## 2015-09-11 HISTORY — DX: Anemia, unspecified: D64.9

## 2015-09-11 HISTORY — PX: AIKEN OSTEOTOMY: SHX6331

## 2015-09-11 HISTORY — DX: Gastro-esophageal reflux disease without esophagitis: K21.9

## 2015-09-11 LAB — DIFFERENTIAL
BASOS ABS: 0 10*3/uL (ref 0–0.1)
BASOS PCT: 0 %
EOS PCT: 4 %
Eosinophils Absolute: 0.2 10*3/uL (ref 0–0.7)
LYMPHS ABS: 2.3 10*3/uL (ref 1.0–3.6)
Lymphocytes Relative: 42 %
MONOS PCT: 8 %
Monocytes Absolute: 0.4 10*3/uL (ref 0.2–0.9)
NEUTROS PCT: 46 %
Neutro Abs: 2.4 10*3/uL (ref 1.4–6.5)

## 2015-09-11 LAB — CBC
HCT: 36.9 % (ref 35.0–47.0)
Hemoglobin: 11.7 g/dL — ABNORMAL LOW (ref 12.0–16.0)
MCH: 26.4 pg (ref 26.0–34.0)
MCHC: 31.7 g/dL — ABNORMAL LOW (ref 32.0–36.0)
MCV: 83.2 fL (ref 80.0–100.0)
Platelets: 305 10*3/uL (ref 150–440)
RBC: 4.43 MIL/uL (ref 3.80–5.20)
RDW: 13.6 % (ref 11.5–14.5)
WBC: 5.4 10*3/uL (ref 3.6–11.0)

## 2015-09-11 LAB — POCT PREGNANCY, URINE: PREG TEST UR: NEGATIVE

## 2015-09-11 SURGERY — CORRECTION, HALLUX VALGUS
Anesthesia: General | Site: Foot | Laterality: Right | Wound class: Clean

## 2015-09-11 MED ORDER — ACETAMINOPHEN 10 MG/ML IV SOLN
INTRAVENOUS | Status: AC
  Filled 2015-09-11: qty 100

## 2015-09-11 MED ORDER — LACTATED RINGERS IV SOLN
INTRAVENOUS | Status: DC
  Administered 2015-09-11: 07:00:00 via INTRAVENOUS

## 2015-09-11 MED ORDER — NEOMYCIN-POLYMYXIN B GU 40-200000 IR SOLN
Status: AC
  Filled 2015-09-11: qty 2

## 2015-09-11 MED ORDER — FENTANYL CITRATE (PF) 100 MCG/2ML IJ SOLN: 25.0000 ug | INTRAMUSCULAR | Status: DC | PRN

## 2015-09-11 MED ORDER — CEFAZOLIN SODIUM-DEXTROSE 2-3 GM-% IV SOLR
2.0000 g | Freq: Once | INTRAVENOUS | Status: AC
  Administered 2015-09-11: 2 g via INTRAVENOUS

## 2015-09-11 MED ORDER — OXYCODONE-ACETAMINOPHEN 5-325 MG PO TABS: 1.0000 | ORAL_TABLET | ORAL | Status: DC | PRN

## 2015-09-11 MED ORDER — FENTANYL CITRATE (PF) 100 MCG/2ML IJ SOLN
INTRAMUSCULAR | Status: DC | PRN
Start: 2015-09-11 — End: 2015-09-11
  Administered 2015-09-11 (×2): 50 ug via INTRAVENOUS

## 2015-09-11 MED ORDER — PROPOFOL 10 MG/ML IV BOLUS
INTRAVENOUS | Status: DC | PRN
  Administered 2015-09-11: 50 mg via INTRAVENOUS
  Administered 2015-09-11: 30 mg via INTRAVENOUS

## 2015-09-11 MED ORDER — KETOROLAC TROMETHAMINE 30 MG/ML IJ SOLN
INTRAMUSCULAR | Status: DC | PRN
  Administered 2015-09-11: 30 mg via INTRAVENOUS

## 2015-09-11 MED ORDER — MIDAZOLAM HCL 2 MG/2ML IJ SOLN
INTRAMUSCULAR | Status: DC | PRN
  Administered 2015-09-11: 2 mg via INTRAVENOUS

## 2015-09-11 MED ORDER — BUPIVACAINE HCL 0.5 % IJ SOLN
INTRAMUSCULAR | Status: DC | PRN
  Administered 2015-09-11: 10 mL

## 2015-09-11 MED ORDER — LIDOCAINE HCL (PF) 2 % IJ SOLN
INTRAMUSCULAR | Status: DC | PRN
  Administered 2015-09-11: 100 mg via INTRADERMAL

## 2015-09-11 MED ORDER — CEFAZOLIN SODIUM-DEXTROSE 2-3 GM-% IV SOLR
INTRAVENOUS | Status: AC
  Filled 2015-09-11: qty 50

## 2015-09-11 MED ORDER — NEOMYCIN-POLYMYXIN B GU 40-200000 IR SOLN
Status: DC | PRN
  Administered 2015-09-11: 2 mL

## 2015-09-11 MED ORDER — GLYCOPYRROLATE 0.2 MG/ML IJ SOLN
INTRAMUSCULAR | Status: DC | PRN
  Administered 2015-09-11: 0.2 mg via INTRAVENOUS

## 2015-09-11 MED ORDER — PROPOFOL 500 MG/50ML IV EMUL
INTRAVENOUS | Status: DC | PRN
  Administered 2015-09-11: 140 ug/kg/min via INTRAVENOUS

## 2015-09-11 MED ORDER — ONDANSETRON HCL 4 MG/2ML IJ SOLN
INTRAMUSCULAR | Status: DC | PRN
  Administered 2015-09-11: 4 mg via INTRAVENOUS

## 2015-09-11 MED ORDER — BUPIVACAINE HCL (PF) 0.5 % IJ SOLN
INTRAMUSCULAR | Status: AC
  Filled 2015-09-11: qty 30

## 2015-09-11 MED ORDER — ONDANSETRON HCL 4 MG/2ML IJ SOLN: 4.0000 mg | Freq: Once | INTRAMUSCULAR | Status: DC | PRN

## 2015-09-11 MED ORDER — ACETAMINOPHEN 10 MG/ML IV SOLN
INTRAVENOUS | Status: DC | PRN
  Administered 2015-09-11: 1000 mg via INTRAVENOUS

## 2015-09-11 MED ORDER — LIDOCAINE HCL (PF) 1 % IJ SOLN
INTRAMUSCULAR | Status: AC
  Filled 2015-09-11: qty 30

## 2015-09-11 SURGICAL SUPPLY — 56 items
BAG COUNTER SPONGE EZ (MISCELLANEOUS) ×2 IMPLANT
BANDAGE ELASTIC 4 CLIP NS LF (GAUZE/BANDAGES/DRESSINGS) ×2 IMPLANT
BANDAGE STRETCH 3X4.1 STRL (GAUZE/BANDAGES/DRESSINGS) ×4 IMPLANT
BLADE MED AGGRESSIVE (BLADE) ×2 IMPLANT
BLADE OSC/SAGITTAL 5.5X25 (BLADE) ×2 IMPLANT
BLADE OSC/SAGITTAL MD 5.5X18 (BLADE) ×2 IMPLANT
BLADE OSCILLATING/SAGITTAL (BLADE)
BLADE SURG 15 STRL LF DISP TIS (BLADE) IMPLANT
BLADE SURG 15 STRL SS (BLADE)
BLADE SURG MINI STRL (BLADE) IMPLANT
BLADE SW THK.38XMED LNG THN (BLADE) IMPLANT
BNDG ESMARK 4X12 TAN STRL LF (GAUZE/BANDAGES/DRESSINGS) IMPLANT
BNDG GAUZE 4.5X4.1 6PLY STRL (MISCELLANEOUS) IMPLANT
CANISTER SUCT 1200ML W/VALVE (MISCELLANEOUS) ×2 IMPLANT
CUFF TOURN 18 STER (MISCELLANEOUS) IMPLANT
CUFF TOURN DUAL PL 12 NO SLV (MISCELLANEOUS) ×2 IMPLANT
DRAPE FLUOR MINI C-ARM 54X84 (DRAPES) ×2 IMPLANT
DRSG TELFA 3X8 NADH (GAUZE/BANDAGES/DRESSINGS) IMPLANT
DURAPREP 26ML APPLICATOR (WOUND CARE) ×2 IMPLANT
GAUZE PETRO XEROFOAM 1X8 (MISCELLANEOUS) ×2 IMPLANT
GAUZE SPONGE 4X4 12PLY STRL (GAUZE/BANDAGES/DRESSINGS) ×2 IMPLANT
GAUZE STRETCH 2X75IN STRL (MISCELLANEOUS) IMPLANT
GLOVE BIO SURGEON STRL SZ7.5 (GLOVE) ×2 IMPLANT
GLOVE INDICATOR 8.0 STRL GRN (GLOVE) ×2 IMPLANT
GOWN STRL REUS W/ TWL LRG LVL3 (GOWN DISPOSABLE) ×2 IMPLANT
GOWN STRL REUS W/TWL LRG LVL3 (GOWN DISPOSABLE) ×2
LABEL OR SOLS (LABEL) ×2 IMPLANT
NEEDLE FILTER BLUNT 18X 1/2SAF (NEEDLE) ×1
NEEDLE FILTER BLUNT 18X1 1/2 (NEEDLE) ×1 IMPLANT
NEEDLE HYPO 25X1 1.5 SAFETY (NEEDLE) ×4 IMPLANT
NS IRRIG 1000ML POUR BTL (IV SOLUTION) ×2 IMPLANT
NS IRRIG 500ML POUR BTL (IV SOLUTION) ×2 IMPLANT
PACK EXTREMITY ARMC (MISCELLANEOUS) ×2 IMPLANT
PAD CAST CTTN 4X4 STRL (SOFTGOODS) IMPLANT
PAD GROUND ADULT SPLIT (MISCELLANEOUS) ×2 IMPLANT
PADDING CAST 2X4YD ST (MISCELLANEOUS)
PADDING CAST BLEND 2X4 STRL (MISCELLANEOUS) IMPLANT
PADDING CAST COTTON 4X4 STRL (SOFTGOODS)
PENCIL ELECTRO HAND CTR (MISCELLANEOUS) ×2 IMPLANT
RASP SM TEAR CROSS CUT (RASP) ×2 IMPLANT
SOL PREP PVP 2OZ (MISCELLANEOUS) ×2
SOLUTION PREP PVP 2OZ (MISCELLANEOUS) ×1 IMPLANT
SPLINT CAST 1 STEP 4X30 (MISCELLANEOUS) ×2 IMPLANT
SPLINT FAST PLASTER 5X30 (CAST SUPPLIES)
SPLINT PLASTER CAST FAST 5X30 (CAST SUPPLIES) IMPLANT
STAPLE NIT 15X12X10.9MMX1.5 (Staple) ×2 IMPLANT
STAPLE NIT SUPER 13X10X10 (Staple) ×2 IMPLANT
STAPLE SPR MET 9X9X9 1.5 (Staple) ×2 IMPLANT
STOCKINETTE STRL 6IN 960660 (GAUZE/BANDAGES/DRESSINGS) ×2 IMPLANT
STRAP SAFETY BODY (MISCELLANEOUS) ×2 IMPLANT
STRIP CLOSURE SKIN 1/4X4 (GAUZE/BANDAGES/DRESSINGS) ×2 IMPLANT
SUT VIC AB 4-0 FS2 27 (SUTURE) ×2 IMPLANT
SWABSTK COMLB BENZOIN TINCTURE (MISCELLANEOUS) ×2 IMPLANT
SYRINGE 10CC LL (SYRINGE) ×2 IMPLANT
WIRE Z .045 C-WIRE SPADE TIP (WIRE) IMPLANT
WIRE Z .062 C-WIRE SPADE TIP (WIRE) ×2 IMPLANT

## 2015-09-11 NOTE — Anesthesia Postprocedure Evaluation (Signed)
  Anesthesia Post-op Note  Patient: Becky Manning  Procedure(s) Performed: Procedure(s): HALLUX VALGUS AUSTIN (Right) AIKEN OSTEOTOMY (Right)  Anesthesia type:General  Patient location: PACU  Post pain: Pain level controlled  Post assessment: Post-op Vital signs reviewed, Patient's Cardiovascular Status Stable, Respiratory Function Stable, Patent Airway and No signs of Nausea or vomiting  Post vital signs: Reviewed and stable  Last Vitals:  Filed Vitals:   09/11/15 1020  BP: 119/68  Pulse: 65  Temp: 36.2 C  Resp: 16    Level of consciousness: awake, alert  and patient cooperative  Complications: No apparent anesthesia complications

## 2015-09-11 NOTE — Anesthesia Procedure Notes (Signed)
Date/Time: 09/11/2015 7:17 AM Performed by: Stormy Fabian Pre-anesthesia Checklist: Patient identified, Emergency Drugs available, Suction available and Patient being monitored Patient Re-evaluated:Patient Re-evaluated prior to inductionOxygen Delivery Method: Nasal cannula

## 2015-09-11 NOTE — H&P (Signed)
  History and physical by her medical doctor in the chart is reviewed. No interval change. Patient is stable for surgery

## 2015-09-11 NOTE — Interval H&P Note (Signed)
History and Physical Interval Note:  09/11/2015 7:13 AM  Becky Manning  has presented today for surgery, with the diagnosis of HALLUX VALGUS  The various methods of treatment have been discussed with the patient and family. After consideration of risks, benefits and other options for treatment, the patient has consented to  Procedure(s): HALLUX VALGUS AUSTIN (Right) AIKEN OSTEOTOMY (Right) as a surgical intervention .  The patient's history has been reviewed, patient examined, no change in status, stable for surgery.  I have reviewed the patient's chart and labs.  Questions were answered to the patient's satisfaction.     CLINE,TODD W.

## 2015-09-11 NOTE — Discharge Instructions (Addendum)
AMBULATORY SURGERY  DISCHARGE INSTRUCTIONS   1) The drugs that you were given will stay in your system until tomorrow so for the next 24 hours you should not:  A) Drive an automobile B) Make any legal decisions C) Drink any alcoholic beverage   2) You may resume regular meals tomorrow.  Today it is better to start with liquids and gradually work up to solid foods.  You may eat anything you prefer, but it is better to start with liquids, then soup and crackers, and gradually work up to solid foods.   3) Please notify your doctor immediately if you have any unusual bleeding, trouble breathing, redness and pain at the surgery site, drainage, fever, or pain not relieved by medication.    4) Additional Instructions:                   A. Elevate the right leg on 2 pillows.  B. Keep the bandage clean, dry, and do not remove..  C. Sponge bathe only the right lower extremity.  D. no weight on the right leg using crutches for nonweightbearing.  E. take one pain pill, Percocet, every 4 hours as needed for pain  Do not take TRAMADOL WHILE TAKING THE PERCOCET FOR PAIN....      Please contact your physician with any problems or Same Day Surgery at [phone], Monday through Friday 6 am to 4 pm, or [place] at [facility] number at [phone].

## 2015-09-11 NOTE — Anesthesia Preprocedure Evaluation (Signed)
Anesthesia Evaluation  Patient identified by MRN, date of birth, ID band Patient awake    Reviewed: Allergy & Precautions, NPO status , Patient's Chart, lab work & pertinent test results  History of Anesthesia Complications (+) history of anesthetic complications  Airway Mallampati: II       Dental  (+) Teeth Intact, Missing   Pulmonary former smoker (quit x 4 yrs),           Cardiovascular negative cardio ROS       Neuro/Psych negative neurological ROS     GI/Hepatic Neg liver ROS, GERD  Medicated,  Endo/Other  Hypothyroidism   Renal/GU negative Renal ROS     Musculoskeletal negative musculoskeletal ROS (+)   Abdominal   Peds  Hematology  (+) anemia ,   Anesthesia Other Findings   Reproductive/Obstetrics                             Anesthesia Physical Anesthesia Plan  ASA: II  Anesthesia Plan: General   Post-op Pain Management:    Induction: Intravenous  Airway Management Planned: Nasal Cannula  Additional Equipment:   Intra-op Plan:   Post-operative Plan:   Informed Consent: I have reviewed the patients History and Physical, chart, labs and discussed the procedure including the risks, benefits and alternatives for the proposed anesthesia with the patient or authorized representative who has indicated his/her understanding and acceptance.     Plan Discussed with:   Anesthesia Plan Comments:         Anesthesia Quick Evaluation

## 2015-09-11 NOTE — Addendum Note (Signed)
Addendum  created 09/11/15 1216 by Stormy Fabian, CRNA   Modules edited: Charges VN

## 2015-09-11 NOTE — Transfer of Care (Signed)
Immediate Anesthesia Transfer of Care Note  Patient: Becky Manning  Procedure(s) Performed: Procedure(s): HALLUX VALGUS AUSTIN (Right) Quintella Reichert OSTEOTOMY (Right)  Patient Location: PACU  Anesthesia Type:General  Level of Consciousness: sedated  Airway & Oxygen Therapy: Patient Spontanous Breathing and Patient connected to face mask oxygen  Post-op Assessment: Report given to RN and Post -op Vital signs reviewed and stable  Post vital signs: Reviewed and stable  Last Vitals:  Filed Vitals:   09/11/15 0943  BP: 119/81  Pulse: 98  Temp: 37.2 C  Resp: 18    Complications: No apparent anesthesia complications

## 2015-09-11 NOTE — Op Note (Signed)
Date of operation: 09/11/2015.  Surgeon: Ricci Barker DPM.  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  Postoperative diagnosis: Same.  Procedures: 1. Modified McBride bunionectomy right great toe joint. 2. Base wedge osteotomy right first metatarsal. 3. Akin osteotomy right hallux.  Anesthesia: Local Mac.  Hemostasis: Pneumatic tourniquet right ankle 250 mmHg.  Estimated blood loss: Minimal.  Materials: Nitinol bone staples in length of 15 mm, 13 mm, and 9 mm.  Complications: None apparent.  Operative indications: This is a 34 year old female with a history of a painful bunion on her right foot and she elects to have surgical correction as was previously performed on her left foot.  Operative procedure: The patient was taken to the operating room and placed on the table in the supine position. Following satisfactory sedation the right foot was anesthetized with 10 cc of 0.5% bupivacaine plain around the right midfoot and base of the first metatarsal. A pneumatic tourniquet was applied at the level of the right ankle and foot was prepped and draped in the usual sterile fashion. The foot was exsanguinated and the tourniquet inflated to 250 mmHg.   Attention was then directed to the dorsal aspect of the right foot where an approximate 5 cm curvilinear incision was made over the distal first metatarsal and metatarsophalangeal joint and onto the base of the great toe area and the incision was deepened via sharp and blunt dissection with care taken to retract and coagulate all bleeders and neurovascular structures. At the level of the joint a linear capsulotomy was performed and the capsular and periosteal tissues reflected off of the head of the first metatarsal. There was noted to be some significant cartilage loss along the medial groove of the metatarsal head with a bony prominence medial. Next using a bone saw the medial eminence was resected in  toto. There was still noted to be some contracture of the toe and at this point attention was directed towards the first interspace where the incision was deepened down to the lateral aspect of the joint where the transverse metatarsal ligament was incised. The abductor tendon was then transected off of the lateral aspect of the first metatarsal base and a fibular sesamoid release with lateral capsulotomy was performed which reduced the contracture in the great toe. The wound was then flushed with copious amounts of sterile saline.    Attention was then directed to the dorsal aspect of the foot where the incision was lengthened proximally over the base of the first metatarsal another 4 cm. The incision was then deepened down to the level of the bone with care taken to retract the extensor tendon and neurovascular structures. A linear periosteal incision was made and the periosteal tissues reflected off of the dorsal lateral and medial base of the first metatarsal. Using a bone saw a wedge osteotomy was taken out of the base of the first metatarsal with the apex medial and the base lateral. The wedge of bone was removed and then the osteotomy was reduced and feathered down until the first metatarsal was in a good rectus alignment with the remaining metatarsals. Intraoperative FluoroScan views revealed good reduction of the deformity. Next a 15 mm x 12 mm x 12 mm bone staple was then placed along the medial cortex of the first metatarsal across the osteotomy site. Next a 13 mm x 10 mm x 10 mm bone staple was then placed dorsal lateral and there was noted to be good reduction of the osteotomy  and alignment of the first ray. There was still noted to be some mild lateral deviation of the hallux within the great toe itself sewed the distal aspect of the incision was then lengthened another centimeter to reveal the base of the proximal phalanx. Periosteal incision was made at the base and the periosteum reflected off of  the dorsal medial aspect of the base of the proximal phalanx. An osteotomy was then performed through the medial aspect of the cortex with care taken to leave the lateral hinge intact. The osteotomy was feathered down until the toes and a good rectus alignment. Next a 9 mm x 9 mm x 9 mm bone staple was then placed across the osteotomy in the hallux. Intraoperative FluoroScan views revealed good reduction of all of the deformities and good placement of the staples. The wounds were then all flushed with copious amounts of sterile saline and closed in layers using 40 Vicryls running suture for all layers from periosteal and capsular closure to deep and superficial subcutaneous closure and then followed by skin closure. Tincture of benzoin and Steri-Strips applied to the incision followed by Xeroform and a sterile gauze bandage. The tourniquet was released and blood flow noted to return immediately to the right foot and all digits. Next a fiberglass posterior splint was then applied to the right lower extremity with the foot 90 relative to the leg. Patient tolerated the procedure and anesthesia well and was transported to the PACU with vital signs stable and in good condition.

## 2015-11-07 ENCOUNTER — Emergency Department: Payer: Medicaid Other

## 2015-11-07 ENCOUNTER — Emergency Department
Admission: EM | Admit: 2015-11-07 | Discharge: 2015-11-07 | Disposition: A | Discharge status: Home / Self Care | Payer: Medicaid Other | Attending: Emergency Medicine | Admitting: Emergency Medicine

## 2015-11-07 ENCOUNTER — Encounter: Payer: Self-pay | Admitting: Emergency Medicine

## 2015-11-07 DIAGNOSIS — Z79899 Other long term (current) drug therapy: Secondary | ICD-10-CM | POA: Diagnosis not present

## 2015-11-07 DIAGNOSIS — E860 Dehydration: Secondary | ICD-10-CM | POA: Diagnosis not present

## 2015-11-07 DIAGNOSIS — Z87891 Personal history of nicotine dependence: Secondary | ICD-10-CM | POA: Insufficient documentation

## 2015-11-07 DIAGNOSIS — R1030 Lower abdominal pain, unspecified: Secondary | ICD-10-CM | POA: Diagnosis not present

## 2015-11-07 DIAGNOSIS — R112 Nausea with vomiting, unspecified: Secondary | ICD-10-CM | POA: Diagnosis present

## 2015-11-07 LAB — URINALYSIS COMPLETE WITH MICROSCOPIC (ARMC ONLY)
BACTERIA UA: NONE SEEN
BILIRUBIN URINE: NEGATIVE
GLUCOSE, UA: 50 mg/dL — AB
HGB URINE DIPSTICK: NEGATIVE
Leukocytes, UA: NEGATIVE
Nitrite: NEGATIVE
Protein, ur: 30 mg/dL — AB
RBC / HPF: NONE SEEN RBC/hpf (ref 0–5)
SQUAMOUS EPITHELIAL / LPF: NONE SEEN
Specific Gravity, Urine: 1.024 (ref 1.005–1.030)
WBC UA: NONE SEEN WBC/hpf (ref 0–5)
pH: 7 (ref 5.0–8.0)

## 2015-11-07 LAB — CBC WITH DIFFERENTIAL/PLATELET
BASOS PCT: 1 %
Basophils Absolute: 0.1 10*3/uL (ref 0–0.1)
EOS ABS: 0 10*3/uL (ref 0–0.7)
Eosinophils Relative: 0 %
HEMATOCRIT: 41.1 % (ref 35.0–47.0)
HEMOGLOBIN: 13.3 g/dL (ref 12.0–16.0)
LYMPHS ABS: 1.9 10*3/uL (ref 1.0–3.6)
Lymphocytes Relative: 34 %
MCH: 27 pg (ref 26.0–34.0)
MCHC: 32.4 g/dL (ref 32.0–36.0)
MCV: 83.4 fL (ref 80.0–100.0)
MONOS PCT: 4 %
Monocytes Absolute: 0.3 10*3/uL (ref 0.2–0.9)
NEUTROS ABS: 3.5 10*3/uL (ref 1.4–6.5)
NEUTROS PCT: 61 %
Platelets: 327 10*3/uL (ref 150–440)
RBC: 4.93 MIL/uL (ref 3.80–5.20)
RDW: 14 % (ref 11.5–14.5)
WBC: 5.7 10*3/uL (ref 3.6–11.0)

## 2015-11-07 LAB — COMPREHENSIVE METABOLIC PANEL
ALBUMIN: 4.5 g/dL (ref 3.5–5.0)
ALK PHOS: 71 U/L (ref 38–126)
ALT: 19 U/L (ref 14–54)
ANION GAP: 13 (ref 5–15)
AST: 32 U/L (ref 15–41)
BILIRUBIN TOTAL: 0.8 mg/dL (ref 0.3–1.2)
BUN: 8 mg/dL (ref 6–20)
CALCIUM: 9.8 mg/dL (ref 8.9–10.3)
CO2: 17 mmol/L — AB (ref 22–32)
CREATININE: 0.82 mg/dL (ref 0.44–1.00)
Chloride: 111 mmol/L (ref 101–111)
GFR calc Af Amer: >60 mL/min (ref >60)
GFR calc non Af Amer: >60 mL/min (ref >60)
GLUCOSE: 122 mg/dL — AB (ref 65–99)
Potassium: 4 mmol/L (ref 3.5–5.1)
SODIUM: 141 mmol/L (ref 135–145)
Total Protein: 7.8 g/dL (ref 6.5–8.1)

## 2015-11-07 LAB — POCT PREGNANCY, URINE: PREG TEST UR: NEGATIVE

## 2015-11-07 LAB — LIPASE, BLOOD: Lipase: 22 U/L (ref 11–51)

## 2015-11-07 MED ORDER — ONDANSETRON HCL 4 MG PO TABS: 4.0000 mg | ORAL_TABLET | Freq: Three times a day (TID) | ORAL | Status: DC | PRN

## 2015-11-07 MED ORDER — ONDANSETRON HCL 4 MG/2ML IJ SOLN
4.0000 mg | Freq: Once | INTRAMUSCULAR | Status: AC
  Administered 2015-11-07: 4 mg via INTRAVENOUS
  Filled 2015-11-07: qty 2

## 2015-11-07 MED ORDER — LORAZEPAM 2 MG/ML IJ SOLN
1.0000 mg | Freq: Once | INTRAMUSCULAR | Status: AC
  Administered 2015-11-07: 1 mg via INTRAVENOUS
  Filled 2015-11-07: qty 1

## 2015-11-07 MED ORDER — SODIUM CHLORIDE 0.9 % IV SOLN
1000.0000 mL | Freq: Once | INTRAVENOUS | Status: AC
  Administered 2015-11-07: 1000 mL via INTRAVENOUS

## 2015-11-07 NOTE — ED Notes (Signed)
Patient transported to X-ray 

## 2015-11-07 NOTE — ED Notes (Signed)
Pt reports being dehydrated; states had a "couple of drinks last night" and nausea/vomiting ("too many") since this morning

## 2015-11-07 NOTE — Discharge Instructions (Signed)
Dehydration, Adult °Dehydration is a condition in which you do not have enough fluid or water in your body. It happens when you take in less fluid than you lose. Vital organs such as the kidneys, brain, and heart cannot function without a proper amount of fluids. Any loss of fluids from the body can cause dehydration.  °Dehydration can range from mild to severe. This condition should be treated right away to help prevent it from becoming severe. °CAUSES  °This condition may be caused by: °· Vomiting. °· Diarrhea. °· Excessive sweating, such as when exercising in hot or humid weather. °· Not drinking enough fluid during strenuous exercise or during an illness. °· Excessive urine output. °· Fever. °· Certain medicines. °RISK FACTORS °This condition is more likely to develop in: °· People who are taking certain medicines that cause the body to lose excess fluid (diuretics).   °· People who have a chronic illness, such as diabetes, that may increase urination. °· Older adults.   °· People who live at high altitudes.   °· People who participate in endurance sports.   °SYMPTOMS  °Mild Dehydration °· Thirst. °· Dry lips. °· Slightly dry mouth. °· Dry, warm skin. °Moderate Dehydration °· Very dry mouth.   °· Muscle cramps.   °· Dark urine and decreased urine production.   °· Decreased tear production.   °· Headache.   °· Light-headedness, especially when you stand up from a sitting position.   °Severe Dehydration °· Changes in skin.   °¨ Cold and clammy skin.   °¨ Skin does not spring back quickly when lightly pinched and released.   °· Changes in body fluids.   °¨ Extreme thirst.   °¨ No tears.   °¨ Not able to sweat when body temperature is high, such as in hot weather.   °¨ Minimal urine production.   °· Changes in vital signs.   °¨ Rapid, weak pulse (more than 100 beats per minute when you are sitting still).   °¨ Rapid breathing.   °¨ Low blood pressure.   °· Other changes.   °¨ Sunken eyes.   °¨ Cold hands and feet.    °¨ Confusion. °¨ Lethargy and difficulty being awakened. °¨ Fainting (syncope).   °¨ Short-term weight loss.   °¨ Unconsciousness. °DIAGNOSIS  °This condition may be diagnosed based on your symptoms. You may also have tests to determine how severe your dehydration is. These tests may include:  °· Urine tests.   °· Blood tests.   °TREATMENT  °Treatment for this condition depends on the severity. Mild or moderate dehydration can often be treated at home. Treatment should be started right away. Do not wait until dehydration becomes severe. Severe dehydration needs to be treated at the hospital. °Treatment for Mild Dehydration °· Drinking plenty of water to replace the fluid you have lost.   °· Replacing minerals in your blood (electrolytes) that you may have lost.   °Treatment for Moderate Dehydration  °· Consuming oral rehydration solution (ORS). °Treatment for Severe Dehydration °· Receiving fluid through an IV tube.   °· Receiving electrolyte solution through a feeding tube that is passed through your nose and into your stomach (nasogastric tube or NG tube). °· Correcting any abnormalities in electrolytes. °HOME CARE INSTRUCTIONS  °· Drink enough fluid to keep your urine clear or pale yellow.   °· Drink water or fluid slowly by taking small sips. You can also try sucking on ice cubes.  °· Have food or beverages that contain electrolytes. Examples include bananas and sports drinks. °· Take over-the-counter and prescription medicines only as told by your health care provider.   °· Prepare ORS according to the manufacturer's instructions. Take sips   of ORS every 5 minutes until your urine returns to normal.  If you have vomiting or diarrhea, continue to try to drink water, ORS, or both.   If you have diarrhea, avoid:   Beverages that contain caffeine.   Fruit juice.   Milk.   Carbonated soft drinks.  Do not take salt tablets. This can lead to the condition of having too much sodium in your body  (hypernatremia).  SEEK MEDICAL CARE IF:  You cannot eat or drink without vomiting.  You have had moderate diarrhea during a period of more than 24 hours.  You have a fever. SEEK IMMEDIATE MEDICAL CARE IF:   You have extreme thirst.  You have severe diarrhea.  You have not urinated in 6-8 hours, or you have urinated only a small amount of very dark urine.  You have shriveled skin.  You are dizzy, confused, or both.   This information is not intended to replace advice given to you by your health care provider. Make sure you discuss any questions you have with your health care provider.   Document Released: 12/05/2005 Document Revised: 08/26/2015 Document Reviewed: 04/22/2015 Elsevier Interactive Patient Education [employer]2016 [employer].  Please return immediately if condition worsens. Please contact her primary physician or the physician you were given for referral. If you have any specialist physicians involved in her treatment and plan please also contact them. Thank you for using [facility]. Return especially for fever, bloody diarrhea, bloody vomit, focal abdominal pain or any other new concerns.

## 2015-11-07 NOTE — ED Provider Notes (Signed)
Time Seen: Approximately 2030 I have reviewed the triage notes  Chief Complaint: Emesis and Nausea   History of Present Illness: Becky Manning is a 34 y.o. female who presents with some persistent feelings of nausea and vomited starting last night 5-6 times over the last 24 hours. She states she still feels nauseated. She denies any fever or focal abdominal pain. She denies any loose stool or diarrhea. She states she only had 2 drinks last night. Her symptoms seemed to start afterwards. She denies any dysuria, hematuria, urinary frequency. She denies any vaginal discharge or bleeding. She denies any hematemesis or biliary emesis.   Past Medical History  Diagnosis Date  . GERD (gastroesophageal reflux disease)   . Anemia     H/O  . Hypothyroidism     There are no active problems to display for this patient.   Past Surgical History  Procedure Laterality Date  . Cesarean section N/A     X 3  . Ectopic pregnancy surgery Left   . Hallux valgus austin Left 05/29/2015    Procedure: Hallux valgus correction, left foot ;  Surgeon: Linus Galas, MD;  Location: [facility] ORS;  Service: Podiatry;  Laterality: Left;  . Foot surgery    . Iud removal    . Hallux valgus austin Right 09/11/2015    Procedure: HALLUX VALGUS AUSTIN;  Surgeon: Linus Galas, MD;  Location: [facility] ORS;  Service: Podiatry;  Laterality: Right;  . Aiken osteotomy Right 09/11/2015    Procedure: Quintella Reichert OSTEOTOMY;  Surgeon: Linus Galas, MD;  Location: [facility] ORS;  Service: Podiatry;  Laterality: Right;    Past Surgical History  Procedure Laterality Date  . Cesarean section N/A     X 3  . Ectopic pregnancy surgery Left   . Hallux valgus austin Left 05/29/2015    Procedure: Hallux valgus correction, left foot ;  Surgeon: Linus Galas, MD;  Location: [facility] ORS;  Service: Podiatry;  Laterality: Left;  . Foot surgery    . Iud removal    . Hallux valgus austin Right 09/11/2015    Procedure: HALLUX VALGUS AUSTIN;  Surgeon: Linus Galas, MD;   Location: [facility] ORS;  Service: Podiatry;  Laterality: Right;  . Aiken osteotomy Right 09/11/2015    Procedure: Quintella Reichert OSTEOTOMY;  Surgeon: Linus Galas, MD;  Location: [facility] ORS;  Service: Podiatry;  Laterality: Right;    Current Outpatient Rx  Name  Route  Sig  Dispense  Refill  . levothyroxine (SYNTHROID, LEVOTHROID) 25 MCG tablet   Oral   Take 25 mcg by mouth daily before breakfast.         . medroxyPROGESTERone (DEPO-SUBQ PROVERA 104) 104 MG/0.65ML injection   Subcutaneous   Inject 104 mg into the skin every 3 (three) months.         . Multiple Vitamin (MULTIVITAMIN WITH MINERALS) TABS tablet   Oral   Take 1 tablet by mouth daily.         . ondansetron (ZOFRAN) 4 MG tablet   Oral   Take 1 tablet (4 mg total) by mouth every 8 (eight) hours as needed for nausea or vomiting.   21 tablet   0   . oxyCODONE-acetaminophen (ROXICET) 5-325 MG per tablet   Oral   Take 1-2 tablets by mouth every 4 (four) hours as needed for moderate pain or severe pain.   30 tablet   0   . pantoprazole (PROTONIX) 40 MG tablet   Oral   Take 1 tablet (40 mg total)  by mouth daily. Patient taking differently: Take 40 mg by mouth every morning.    30 tablet   1   . traMADol (ULTRAM) 50 MG tablet   Oral   Take 1 tablet (50 mg total) by mouth every 6 (six) hours as needed. Patient not taking: Reported on 09/08/2015   20 tablet   0     Allergies:  Review of patient's allergies indicates no known allergies.  Family History: Family History  Problem Relation Age of Onset  . Hypertension Mother   . Hypercholesterolemia Mother     Social History: Social History  Substance Use Topics  . Smoking status: Former Smoker -- 0.25 packs/day    Types: Cigarettes    Quit date: 05/19/2012  . Smokeless tobacco: Never Used  . Alcohol Use: No     Review of Systems:   10 point review of systems was performed and was otherwise negative:  Constitutional: No fever Eyes: No visual disturbances ENT:  No sore throat, ear pain Cardiac: No chest pain Respiratory: No shortness of breath, wheezing, or stridor Abdomen: She states. Mild abdominal pain over the lower middle abdominal region, persistent vomiting No diarrhea Endocrine: No weight loss, No night sweats Extremities: No peripheral edema, cyanosis Skin: No rashes, easy bruising Neurologic: No focal weakness, trouble with speech or swollowing Urologic: No dysuria, Hematuria, or urinary frequency *  Physical Exam:  ED Triage Vitals  Enc Vitals Group     BP 11/07/15 2033 129/86 mmHg     Pulse Rate 11/07/15 2033 82     Resp 11/07/15 2033 24     Temp 11/07/15 2039 97.5 F (36.4 C)     Temp Source 11/07/15 2039 Oral     SpO2 11/07/15 2033 100 %     Weight 11/07/15 2033 166 lb (75.297 kg)     Height 11/07/15 2033 5\' 5"  (1.651 m)     Head Cir --      Peak Flow --      Pain Score 11/07/15 2220 7     Pain Loc --      Pain Edu? --      Excl. in GC? --     General: Awake , Alert , and Oriented times 3; GCS 15 patient's very anxious Head: Normal cephalic , atraumatic Eyes: Pupils equal , round, reactive to light Nose/Throat: No nasal drainage, patent upper airway without erythema or exudate.  Neck: Supple, Full range of motion, No anterior adenopathy or palpable thyroid masses Lungs: Clear to ascultation without wheezes , rhonchi, or rales Heart: Regular rate, regular rhythm without murmurs , gallops , or rubs Abdomen: Soft, non tender without rebound, guarding , or rigidity; bowel sounds positive and symmetric in all 4 quadrants. No organomegaly .        Extremities: 2 plus symmetric pulses. No edema, clubbing or cyanosis Neurologic: normal ambulation, Motor symmetric without deficits, sensory intact Skin: warm, dry, no rashes   Labs:   All laboratory work was reviewed including any pertinent negatives or positives listed below:  Labs Reviewed  COMPREHENSIVE METABOLIC PANEL - Abnormal; Notable for the following:    CO2 17  (*)    Glucose, Bld 122 (*)    All other components within normal limits  URINALYSIS COMPLETEWITH MICROSCOPIC ([facility] ONLY) - Abnormal; Notable for the following:    Color, Urine YELLOW (*)    APPearance CLEAR (*)    Glucose, UA 50 (*)    Ketones, ur 1+ (*)    Protein,  ur 30 (*)    All other components within normal limits  CBC WITH DIFFERENTIAL/PLATELET  LIPASE, BLOOD  POC URINE PREG, ED  POCT PREGNANCY, URINE   pregnancy test was negative and laboratory work was reviewed with no significant findings  EKG:  ED ECG REPORT I, Jennye Moccasin, the attending physician, personally viewed and interpreted this ECG.  Date: 11/07/2015 EKG Time: *2039 Rate: 70 Rhythm: normal sinus rhythm QRS Axis: normal Intervals: normal ST/T Wave abnormalities: normal Conduction Disutrbances: none Narrative Interpretation: unremarkable      ED Course: Patient received IV fluids along with Zofran and a dose of Ativan. Patient had symptomatic relief and is able to tolerate oral fluids here in emergency department. She is hemodynamically stable and doesn't appear to have any significant electrolyte abnormality seen her laboratory work.*Reexamination of the patient's abdomen showed no peritoneal signs and I felt this was unlikely to be a presentation for a surgical abdomen    Assessment:  Acute nausea and vomiting Mild dehydration   Final Clinical Impression:   Final diagnoses:  Dehydration     Plan:  Outpatient management Patient was advised to return immediately if condition worsens. Patient was advised to follow up with her primary care physician or other specialized physicians involved and in their current assessment.            Jennye Moccasin, MD 11/07/15 [phone]

## 2016-09-27 ENCOUNTER — Emergency Department
Admission: EM | Admit: 2016-09-27 | Discharge: 2016-09-27 | Disposition: A | Discharge status: Home / Self Care | Payer: No Typology Code available for payment source | Attending: Emergency Medicine | Admitting: Emergency Medicine

## 2016-09-27 ENCOUNTER — Emergency Department: Payer: No Typology Code available for payment source

## 2016-09-27 DIAGNOSIS — E039 Hypothyroidism, unspecified: Secondary | ICD-10-CM | POA: Insufficient documentation

## 2016-09-27 DIAGNOSIS — S40022A Contusion of left upper arm, initial encounter: Secondary | ICD-10-CM | POA: Insufficient documentation

## 2016-09-27 DIAGNOSIS — Z87891 Personal history of nicotine dependence: Secondary | ICD-10-CM | POA: Diagnosis not present

## 2016-09-27 DIAGNOSIS — Y92410 Unspecified street and highway as the place of occurrence of the external cause: Secondary | ICD-10-CM | POA: Insufficient documentation

## 2016-09-27 DIAGNOSIS — S4992XA Unspecified injury of left shoulder and upper arm, initial encounter: Secondary | ICD-10-CM | POA: Diagnosis present

## 2016-09-27 DIAGNOSIS — Y939 Activity, unspecified: Secondary | ICD-10-CM | POA: Diagnosis not present

## 2016-09-27 DIAGNOSIS — Y999 Unspecified external cause status: Secondary | ICD-10-CM | POA: Diagnosis not present

## 2016-09-27 MED ORDER — NAPROXEN 500 MG PO TABS: 500.0000 mg | ORAL_TABLET | Freq: Two times a day (BID) | ORAL | 0 refills | Status: DC

## 2016-09-27 MED ORDER — METHOCARBAMOL 500 MG PO TABS: 500.0000 mg | ORAL_TABLET | Freq: Four times a day (QID) | ORAL | 0 refills | Status: DC | PRN
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
## 2019-09-17 ENCOUNTER — Emergency Department: Payer: Medicaid Other

## 2019-09-17 ENCOUNTER — Other Ambulatory Visit: Payer: Self-pay

## 2019-09-17 ENCOUNTER — Emergency Department
Admission: EM | Admit: 2019-09-17 | Discharge: 2019-09-17 | Disposition: A | Discharge status: Home / Self Care | Payer: Medicaid Other | Attending: Emergency Medicine | Admitting: Emergency Medicine

## 2019-09-17 ENCOUNTER — Encounter: Payer: Self-pay | Admitting: Intensive Care

## 2019-09-17 DIAGNOSIS — R0789 Other chest pain: Secondary | ICD-10-CM | POA: Diagnosis not present

## 2019-09-17 DIAGNOSIS — Z87891 Personal history of nicotine dependence: Secondary | ICD-10-CM | POA: Insufficient documentation

## 2019-09-17 DIAGNOSIS — N3000 Acute cystitis without hematuria: Secondary | ICD-10-CM | POA: Diagnosis not present

## 2019-09-17 DIAGNOSIS — R499 Unspecified voice and resonance disorder: Secondary | ICD-10-CM | POA: Diagnosis not present

## 2019-09-17 DIAGNOSIS — M542 Cervicalgia: Secondary | ICD-10-CM | POA: Diagnosis not present

## 2019-09-17 DIAGNOSIS — E039 Hypothyroidism, unspecified: Secondary | ICD-10-CM | POA: Insufficient documentation

## 2019-09-17 DIAGNOSIS — R590 Localized enlarged lymph nodes: Secondary | ICD-10-CM | POA: Diagnosis not present

## 2019-09-17 DIAGNOSIS — Z79899 Other long term (current) drug therapy: Secondary | ICD-10-CM | POA: Diagnosis not present

## 2019-09-17 DIAGNOSIS — N644 Mastodynia: Secondary | ICD-10-CM | POA: Diagnosis not present

## 2019-09-17 LAB — COMPREHENSIVE METABOLIC PANEL
ALT: 17 U/L (ref 0–44)
AST: 21 U/L (ref 15–41)
Albumin: 3.9 g/dL (ref 3.5–5.0)
Alkaline Phosphatase: 62 U/L (ref 38–126)
Anion gap: 6 (ref 5–15)
BUN: 9 mg/dL (ref 6–20)
CO2: 24 mmol/L (ref 22–32)
Calcium: 8.8 mg/dL — ABNORMAL LOW (ref 8.9–10.3)
Chloride: 107 mmol/L (ref 98–111)
Creatinine, Ser: 0.76 mg/dL (ref 0.44–1.00)
GFR calc Af Amer: >60 mL/min (ref >60)
GFR calc non Af Amer: >60 mL/min (ref >60)
Glucose, Bld: 101 mg/dL — ABNORMAL HIGH (ref 70–99)
Potassium: 3.6 mmol/L (ref 3.5–5.1)
Sodium: 137 mmol/L (ref 135–145)
Total Bilirubin: 0.5 mg/dL (ref 0.3–1.2)
Total Protein: 7.1 g/dL (ref 6.5–8.1)

## 2019-09-17 LAB — CBC WITH DIFFERENTIAL/PLATELET
Abs Immature Granulocytes: 0.01 10*3/uL (ref 0.00–0.07)
Basophils Absolute: 0 10*3/uL (ref 0.0–0.1)
Basophils Relative: 1 %
Eosinophils Absolute: 0.1 10*3/uL (ref 0.0–0.5)
Eosinophils Relative: 2 %
HCT: 36.7 % (ref 36.0–46.0)
Hemoglobin: 12.1 g/dL (ref 12.0–15.0)
Immature Granulocytes: 0 %
Lymphocytes Relative: 42 %
Lymphs Abs: 2.4 10*3/uL (ref 0.7–4.0)
MCH: 26.7 pg (ref 26.0–34.0)
MCHC: 33 g/dL (ref 30.0–36.0)
MCV: 80.8 fL (ref 80.0–100.0)
Monocytes Absolute: 0.8 10*3/uL (ref 0.1–1.0)
Monocytes Relative: 15 %
Neutro Abs: 2.1 10*3/uL (ref 1.7–7.7)
Neutrophils Relative %: 40 %
Platelets: 292 10*3/uL (ref 150–400)
RBC: 4.54 MIL/uL (ref 3.87–5.11)
RDW: 14 % (ref 11.5–15.5)
WBC: 5.4 10*3/uL (ref 4.0–10.5)
nRBC: 0 % (ref 0.0–0.2)

## 2019-09-17 LAB — URINALYSIS, COMPLETE (UACMP) WITH MICROSCOPIC
Bilirubin Urine: NEGATIVE
Glucose, UA: NEGATIVE mg/dL
Hgb urine dipstick: NEGATIVE
Ketones, ur: NEGATIVE mg/dL
Leukocytes,Ua: NEGATIVE
Nitrite: POSITIVE — AB
Protein, ur: NEGATIVE mg/dL
Specific Gravity, Urine: 1.019 (ref 1.005–1.030)
pH: 7 (ref 5.0–8.0)

## 2019-09-17 LAB — TSH: TSH: 1.149 u[IU]/mL (ref 0.350–4.500)

## 2019-09-17 LAB — GROUP A STREP BY PCR: Group A Strep by PCR: NOT DETECTED

## 2019-09-17 LAB — MONONUCLEOSIS SCREEN: Mono Screen: NEGATIVE

## 2019-09-17 LAB — LACTIC ACID, PLASMA: Lactic Acid, Venous: 0.8 mmol/L (ref 0.5–1.9)

## 2019-09-17 LAB — POCT PREGNANCY, URINE: Preg Test, Ur: NEGATIVE

## 2019-09-17 LAB — TROPONIN I (HIGH SENSITIVITY): Troponin I (High Sensitivity): <2 ng/L (ref <18)

## 2019-09-17 MED ORDER — IOHEXOL 300 MG/ML  SOLN
75.0000 mL | Freq: Once | INTRAMUSCULAR | Status: AC | PRN
  Administered 2019-09-17: 75 mL via INTRAVENOUS
  Filled 2019-09-17: qty 75

## 2019-09-17 MED ORDER — CEPHALEXIN 500 MG PO CAPS
500.0000 mg | ORAL_CAPSULE | Freq: Once | ORAL | Status: AC
  Administered 2019-09-17: 500 mg via ORAL
  Filled 2019-09-17: qty 1

## 2019-09-17 MED ORDER — FLUCONAZOLE 150 MG PO TABS: 150.0000 mg | ORAL_TABLET | Freq: Once | ORAL | 0 refills | Status: AC

## 2019-09-17 MED ORDER — PREDNISONE 50 MG PO TABS: 50.0000 mg | ORAL_TABLET | Freq: Every day | ORAL | 0 refills | Status: DC

## 2019-09-17 MED ORDER — CEPHALEXIN 500 MG PO CAPS: 500.0000 mg | ORAL_CAPSULE | Freq: Four times a day (QID) | ORAL | 0 refills | Status: DC

## 2019-09-17 MED ORDER — LANOLIN-PETROLATUM 15.5-53.4 % EX OINT: 1.0000 "application " | TOPICAL_OINTMENT | CUTANEOUS | 1 refills | Status: DC | PRN

## 2019-09-17 MED ORDER — PREDNISONE 20 MG PO TABS
60.0000 mg | ORAL_TABLET | Freq: Once | ORAL | Status: AC
  Administered 2019-09-17: 23:00:00 60 mg via ORAL
  Filled 2019-09-17: qty 3

## 2019-09-17 NOTE — ED Notes (Signed)
Per Roderic Palau, dc repeat lactic and troponin.

## 2019-09-17 NOTE — ED Provider Notes (Signed)
Apolonio Schneiders, attending physician, personally viewed and interpreted this EKG  EKG Time: 1955 Rate: 70 Rhythm: normal sinus rhythm Axis: normal Intervals: qtc 425 QRS: narrow ST changes: no st elevation Impression: normal ekg    Nance Pear, MD 09/17/19 2006

## 2019-09-17 NOTE — ED Triage Notes (Signed)
Patient c/o right sided neck pain with swollen lymph nodes since Saturday that is gradually worsening. HX hypothyroidism. Denies trouble breathing. Able to talk in complete sentences with no problems. Has scheduled zoom tomorrow but reported she cannot wait. Reports this happening before and believe she received steroids to help inflammation.

## 2019-09-17 NOTE — ED Notes (Signed)
Patient transported to CT 

## 2019-09-17 NOTE — ED Provider Notes (Signed)
[facility] Emergency Department Provider Note  ____________________________________________  Time seen: Approximately 6:50 PM  I have reviewed the triage vital signs and the nursing notes.   HISTORY  Chief Complaint No chief complaint on file.    HPI Becky Manning is a 38 y.o. female who presents the emergency department for multiple complaints.  Patient's primary complaint is right-sided neck pain, swelling along the anterior neck, voice changes, nipple pain, right-sided chest pain.  Patient reports that all symptoms began 3 days ago.  Patient reports that she had similar symptoms with her neck in the distant past that was treated with steroids after a negative work-up.  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  She states that she takes Synthroid for her hypothyroidism.  She has had biopsies on her goiters in the past that were all benign.  Patient is also endorsing posterior cervical lymphadenopathy on the right side.  No headaches or visual changes, fevers or chills, nasal congestion, sore throat, cough, shortness of breath, abdominal pain, nausea vomiting.  No medications for his complaint prior to arrival.  No other complaints at this time.         Past Medical History:  Diagnosis Date  . Anemia    H/O  . GERD (gastroesophageal reflux disease)   . Hypothyroidism     There are no active problems to display for this patient.   Past Surgical History:  Procedure Laterality Date  . Quintella Reichert OSTEOTOMY Right 09/11/2015   Procedure: Quintella Reichert OSTEOTOMY;  Surgeon: Linus Galas, MD;  Location: [facility] ORS;  Service: Podiatry;  Laterality: Right;  . CESAREAN SECTION N/A    X 3  . ECTOPIC PREGNANCY SURGERY Left   . FOOT SURGERY    . HALLUX VALGUS AUSTIN Left 05/29/2015   Procedure: Hallux valgus correction, left foot ;  Surgeon: Linus Galas, MD;  Location: [facility] ORS;  Service: Podiatry;  Laterality: Left;  . HALLUX VALGUS AUSTIN Right 09/11/2015   Procedure:  HALLUX VALGUS AUSTIN;  Surgeon: Linus Galas, MD;  Location: [facility] ORS;  Service: Podiatry;  Laterality: Right;  . IUD REMOVAL      Prior to Admission medications   Medication Sig Start Date End Date Taking? Authorizing Provider  cephALEXin (KEFLEX) 500 MG capsule Take 1 capsule (500 mg total) by mouth 4 (four) times daily. 09/17/19   Cuthriell, Delorise Royals, PA-C  Lanolin-Petrolatum 15.5-53.4 % OINT Apply 1 application topically as needed. Up to 6 times/day 09/17/19   Cuthriell, Delorise Royals, PA-C  levothyroxine (SYNTHROID, LEVOTHROID) 25 MCG tablet Take 25 mcg by mouth daily before breakfast.    [provider]  medroxyPROGESTERone (DEPO-SUBQ PROVERA 104) 104 MG/0.65ML injection Inject 104 mg into the skin every 3 (three) months.    [provider]  Multiple Vitamin (MULTIVITAMIN WITH MINERALS) TABS tablet Take 1 tablet by mouth daily.    [provider]  predniSONE (DELTASONE) 50 MG tablet Take 1 tablet (50 mg total) by mouth daily with breakfast. 09/17/19   Cuthriell, Delorise Royals, PA-C    Allergies Patient has no known allergies.  Family History  Problem Relation Age of Onset  . Hypertension Mother   . Hypercholesterolemia Mother     Social History Social History   Tobacco Use  . Smoking status: Former Smoker    Packs/day: 0.25    Types: Cigarettes    Quit date: 05/19/2012    Years since quitting: 7.3  . Smokeless tobacco: Never Used  Substance Use Topics  .  Alcohol use: No  . Drug use: No     Review of Systems  Constitutional: No fever/chills Eyes: No visual changes. No discharge ENT: No upper respiratory complaints.  Positive for anterior neck edema, right-sided cervical lymphadenopathy. Cardiovascular: Right-sided chest pain. Respiratory: no cough. No SOB. Gastrointestinal: No abdominal pain.  No nausea, no vomiting.  No diarrhea.  No constipation. Genitourinary: Negative for dysuria. No hematuria Musculoskeletal: Negative for musculoskeletal  pain. Skin: Negative for rash, abrasions, lacerations, ecchymosis. Neurological: Negative for headaches, focal weakness or numbness. 10-point ROS otherwise negative.  ____________________________________________   PHYSICAL EXAM:  VITAL SIGNS: ED Triage Vitals  Enc Vitals Group     BP 09/17/19 1819 (!) 129/96     Pulse Rate 09/17/19 1819 88     Resp 09/17/19 1819 14     Temp 09/17/19 1819 99.1 F (37.3 C)     Temp Source 09/17/19 1819 Oral     SpO2 09/17/19 1819 100 %     Weight 09/17/19 1820 178 lb (80.7 kg)     Height 09/17/19 1820 5\' 6"  (1.676 m)     Head Circumference --      Peak Flow --      Pain Score 09/17/19 1820 5     Pain Loc --      Pain Edu? --      Excl. in GC? --      Constitutional: Alert and oriented. Well appearing and in no acute distress. Eyes: Conjunctivae are normal. PERRL. EOMI. Head: Atraumatic. ENT:      Ears: EACs and TMs unremarkable bilaterally      Nose: No congestion/rhinnorhea.      Mouth/Throat: Mucous membranes are moist.  Oropharynx is nonerythematous and nonedematous.  Uvula is midline. Neck: No stridor.  Visualization of the anterior neck reveals minimal edema.  No erythema.  Palpation of the thyroid reveals multiple findings consistent with goiters.  Patient is very tender to palpation on the right sided sub-lingular region extending into the right submandibular region.  No palpable abnormality or firmness in this region.  Good range of motion with both flexion, extension, rotation. Hematological/Lymphatic/Immunilogical: Positive for right-sided posterior cervical lymphadenopathy.  No other appreciable lymphadenopathy. Cardiovascular: Normal rate, regular rhythm. Normal S1 and S2.  Good peripheral circulation. Respiratory: Normal respiratory effort without tachypnea or retractions. Lungs CTAB. Good air entry to the bases with no decreased or absent breath sounds. Gastrointestinal: Bowel sounds 4 quadrants. Soft and nontender to palpation.  No guarding or rigidity. No palpable masses. No distention. No CVA tenderness. Musculoskeletal: Full range of motion to all extremities. No gross deformities appreciated. Neurologic:  Normal speech and language. No gross focal neurologic deficits are appreciated.  Skin:  Skin is warm, dry and intact. No rash noted. Psychiatric: Mood and affect are normal. Speech and behavior are normal. Patient exhibits appropriate insight and judgement.   ____________________________________________   LABS (all labs ordered are listed, but only abnormal results are displayed)  Labs Reviewed  COMPREHENSIVE METABOLIC PANEL - Abnormal; Notable for the following components:      Result Value   Glucose, Bld 101 (*)    Calcium 8.8 (*)    All other components within normal limits  URINALYSIS, COMPLETE (UACMP) WITH MICROSCOPIC - Abnormal; Notable for the following components:   Color, Urine YELLOW (*)    APPearance CLOUDY (*)    Nitrite POSITIVE (*)    Bacteria, UA FEW (*)    All other components within normal limits  GROUP A STREP BY  PCR  URINE CULTURE  CBC WITH DIFFERENTIAL/PLATELET  LACTIC ACID, PLASMA  TSH  MONONUCLEOSIS SCREEN  T3  T4  POC URINE PREG, ED  POCT PREGNANCY, URINE  TROPONIN I (HIGH SENSITIVITY)   ____________________________________________  EKG   ____________________________________________  RADIOLOGY I personally viewed and evaluated these images as part of my medical decision making, as well as reviewing the written report by the radiologist.  Dg Chest 1 View  Result Date: 09/17/2019 CLINICAL DATA:  RIGHT-sided chest pain EXAM: CHEST  1 VIEW COMPARISON:  None. FINDINGS: Normal mediastinum and cardiac silhouette. Normal pulmonary vasculature. No evidence of effusion, infiltrate, or pneumothorax. No acute bony abnormality. IMPRESSION: No acute cardiopulmonary process. Electronically Signed   By: Suzy Bouchard M.D.   On: 09/17/2019 19:56   Ct Soft Tissue Neck W  Contrast  Result Date: 09/17/2019 CLINICAL DATA:  38 year old female with right side neck pain and enlarged swollen lymph nodes. History of hypothyroidism. EXAM: CT NECK WITH CONTRAST TECHNIQUE: Multidetector CT imaging of the neck was performed using the standard protocol following the bolus administration of intravenous contrast. CONTRAST:  66mL OMNIPAQUE IOHEXOL 300 MG/ML  SOLN COMPARISON:  Neck CT 09/19/2014. FINDINGS: Pharynx and larynx: Mild motion artifact at the larynx which appears to remain normal. Pharyngeal soft tissue contours are within normal limits, there is stable chronic adenoid hypertrophy. Negative parapharyngeal spaces. Negative retropharyngeal space. Salivary glands: Negative sublingual space. Submandibular and parotid glands are symmetric and within normal limits. Thyroid: Asymmetrically enlarged right thyroid lobe has not significantly changed. No discrete thyroid lesion by CT. Lymph nodes: Since 2015. The number of lymph nodes appears stable, but all nodes have increased in size ranging from 6-10 millimeters short axis today, where as all were sub centimeter in 2015. The level 1 and level 4 nodes are spared. No cystic, necrotic, or heterogeneous nodes. The left side lymph nodes are stable and within normal limits. There is no associated discrete soft tissue inflammation in the right neck. Vascular: Major vascular structures in the neck and at the skull base are patent including the right IJ. Limited intracranial: Negative. Visualized orbits: Negative. Mastoids and visualized paranasal sinuses: Mild mucosal thickening and bubbly opacity in a portion of the left sphenoid sinus, but the remaining visible paranasal sinuses, tympanic cavities, and mastoids are clear. Skeleton: No acute dental abnormality. Negative visible other osseous structures. Upper chest: Negative visible upper chest. Upper lungs are clear. Normal visible mediastinum. Normal visible axillary lymph nodes. IMPRESSION:  Enlarged, reactive appearing right side lymph nodes at the level 2, level 3 and level 5 stations. No cystic or necrotic nodes. No associated neck mass or focal inflammatory process in the neck. The contralateral left side and other nodal stations are stable since 2015 and normal. Negative visible upper chest. The abnormal right lymph nodes are presumed to be inflammatory, but if they fail to resolve as expected then an ultrasound-guided needle biopsy of a dominant node would be recommended. Electronically Signed   By: Genevie Ann M.D.   On: 09/17/2019 21:43    ____________________________________________    PROCEDURES  Procedure(s) performed:    Procedures    Medications  predniSONE (DELTASONE) tablet 60 mg (has no administration in time range)  cephALEXin (KEFLEX) capsule 500 mg (has no administration in time range)  iohexol (OMNIPAQUE) 300 MG/ML solution 75 mL (75 mLs Intravenous Contrast Given 09/17/19 2108)     ____________________________________________   INITIAL IMPRESSION / ASSESSMENT AND PLAN / ED COURSE  Pertinent labs & imaging results that were  available during my care of the patient were reviewed by me and considered in my medical decision making (see chart for details).  Review of the [place] CSRS was performed in accordance of the [facility] prior to dispensing any controlled drugs.  Clinical Course as of Sep 16 2245  Tue Sep 17, 2019  2242 Patient presented to the emergency department complaining of enlarged lymph nodes to the right side of the neck, neck pain, right-sided chest pain.  Patient did have evidence of posterior cervical lymphadenopathy on the right side, was tender in the sub-lingular region but otherwise had a reassuring exam.  Differential included sialoadenitis, parotiditis, Ludwick's angina, Lemierre's disease, neck abscess, reactive lymphadenopathy, Epstein-Barr virus, malignancy.  Overall work-up was reassuring.  Surprisingly, patient did have a nitrite positive  urine but did not endorse any urinary symptoms initially.  As I discussed the results of the urine, patient states that she has had some mild increased urinary frequency and change in urinary smell.  While she does not have any fevers, dysuria, hematuria I will cover the patient with antibiotics.  Patient has had reactive lymphadenopathy in her neck in the past and I suspect that this has occurred again.  Patient will be placed on a short course of steroid for the reactive lymphadenopathy, antibiotics for possible urinary tract infection.  Urine culture is sent as well.  Patient will be prescribed lanolin for cracked nipples and Diflucan for treatment of any symptoms of yeast infection after antibiotic use.   [JC]    Clinical Course User Index [JC] Cuthriell, Delorise Royals, PA-C          Patient's diagnosis is consistent with reactive cervical lymphadenopathy, UTI.  Patient presented to the emergency department multiple complaints.  Imaging and labs are reassuring in regards to complaint of neck.  Likely cervical lymphadenopathy.  Patient has experienced this diagnosis in the past and typically receives steroids for same.  Patient did have a nitrite positive urine that she did not have significant urinary tract symptoms.  Patient will be covered with antibiotics as she has had slight increase in urination and change in urine smell.  Lanolin for complaint of cracked nipples.  Follow-up primary care as needed.. Patient is given ED precautions to return to the ED for any worsening or new symptoms.     ____________________________________________  FINAL CLINICAL IMPRESSION(S) / ED DIAGNOSES  Final diagnoses:  Reactive cervical lymphadenopathy  Acute cystitis without hematuria      NEW MEDICATIONS STARTED DURING THIS VISIT:  ED Discharge Orders         Ordered    predniSONE (DELTASONE) 50 MG tablet  Daily with breakfast     09/17/19 2241    cephALEXin (KEFLEX) 500 MG capsule  4 times daily      09/17/19 2241    Lanolin-Petrolatum 15.5-53.4 % OINT  As needed     09/17/19 2241              This chart was dictated using voice recognition software/Dragon. Despite best efforts to proofread, errors can occur which can change the meaning. Any change was purely unintentional.    Racheal Patches, PA-C 09/17/19 2246    Phineas Semen, MD 09/17/19 2249

## 2019-09-17 NOTE — ED Notes (Signed)
Returned from CT.

## 2019-09-19 LAB — T4: T4, Total: 5.8 ug/dL (ref 4.5–12.0)

## 2019-09-19 LAB — T3: T3, Total: 74 ng/dL (ref 71–180)

## 2019-09-20 LAB — URINE CULTURE: Culture: >=100000 — AB

## 2019-11-19 DIAGNOSIS — E65 Localized adiposity: Secondary | ICD-10-CM | POA: Insufficient documentation

## 2019-11-19 DIAGNOSIS — L574 Cutis laxa senilis: Secondary | ICD-10-CM | POA: Insufficient documentation

## 2021-04-19 IMAGING — CT CT NECK W/ CM
3 of 4 series · 12 of 33 positions shown, 14 images · IV contrast (omnipaque)
Comparison: Neck CT 09/19/2014.

CLINICAL DATA: 38-year-old female with right side neck pain and
enlarged swollen lymph nodes. History of hypothyroidism.

EXAM:
CT NECK WITH CONTRAST
TECHNIQUE: Multidetector CT imaging of the neck was performed using the
standard protocol following the bolus administration of intravenous
contrast.
CONTRAST:  75mL OMNIPAQUE IOHEXOL 300 MG/ML  SOLN

[Series 2: axial neck · axial · 0.53mm/px · z∈[+102,+262]mm · 4 of 121 slices shown, 5 images]
[im 21/121  soft-tissue]
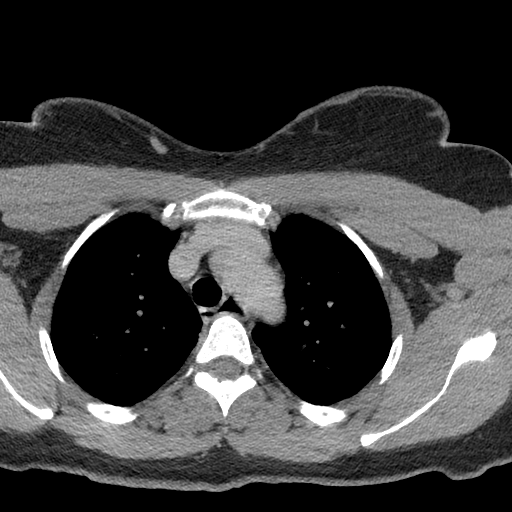
[im 21/121  bone]
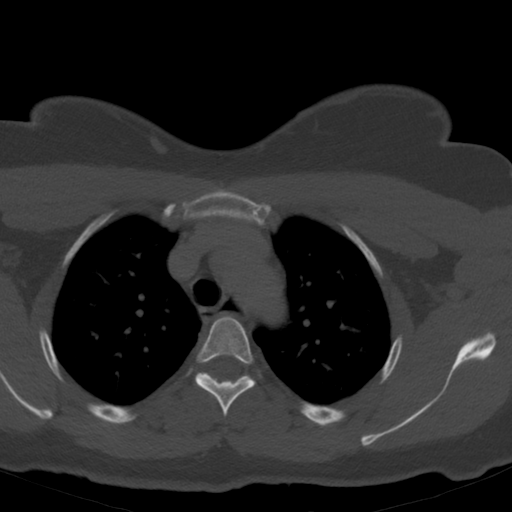
[im 41/121  bone]
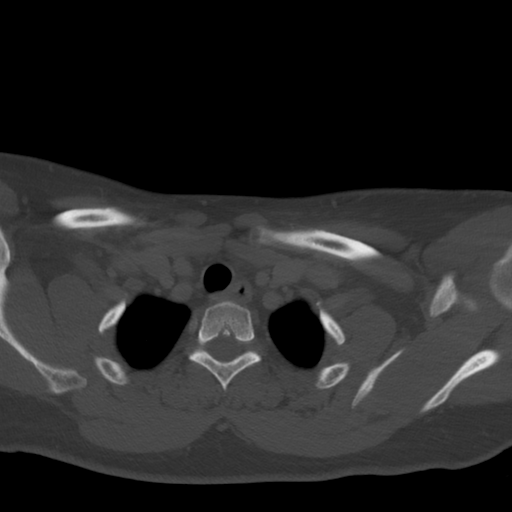
[im 81/121  bone]
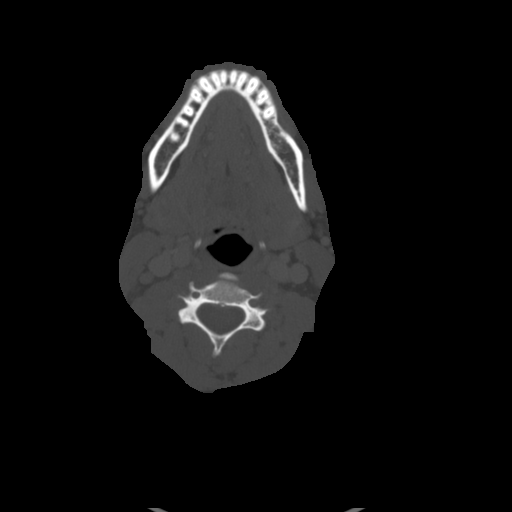
[im 101/121  bone]
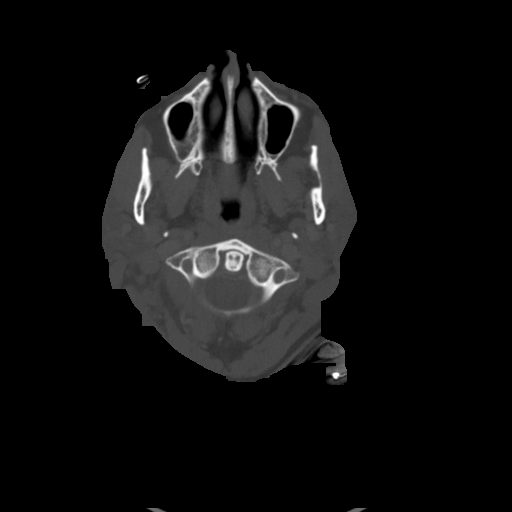

[Series 5: sag neck · sagittal · 0.47mm/px · 5 of 81 slices shown, 6 images]
[im 27/81  bone]
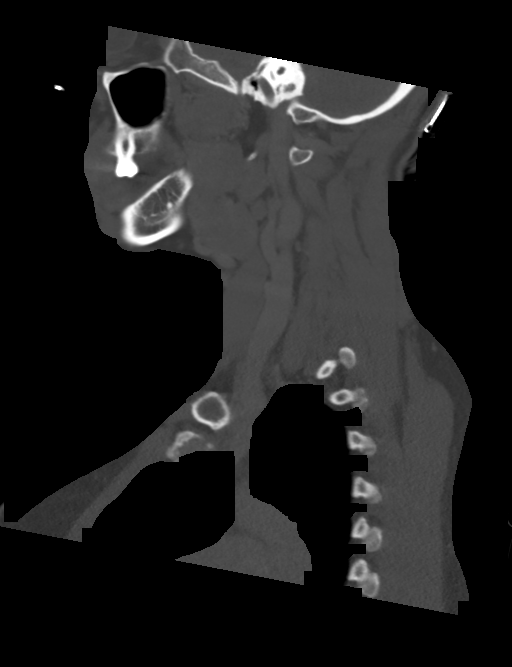
[im 34/81  bone]
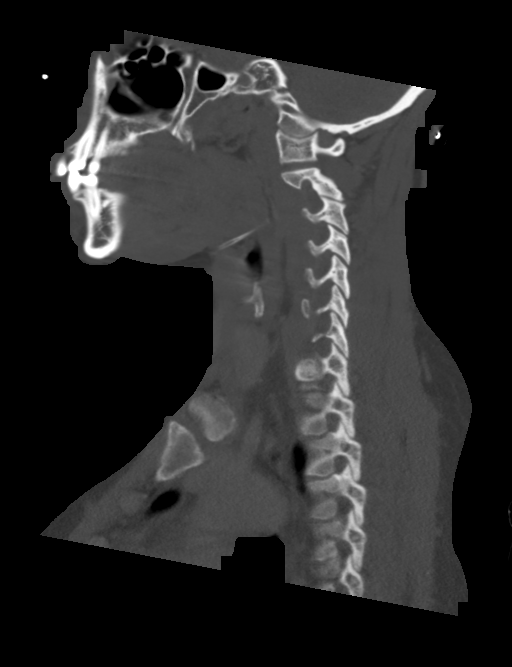
[im 41/81  soft-tissue]
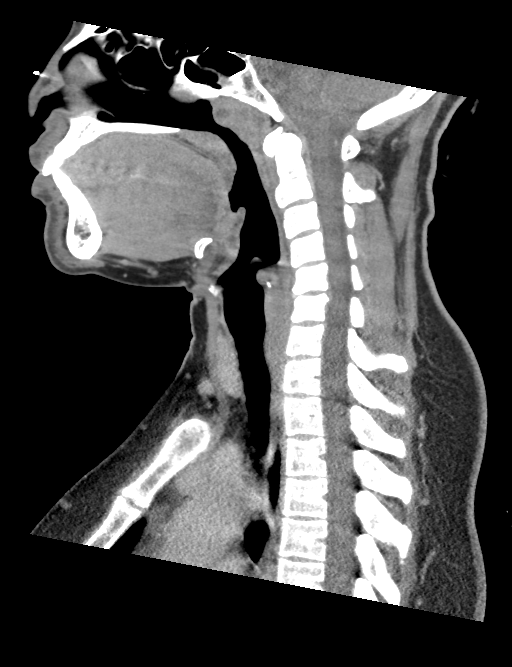
[im 41/81  bone]
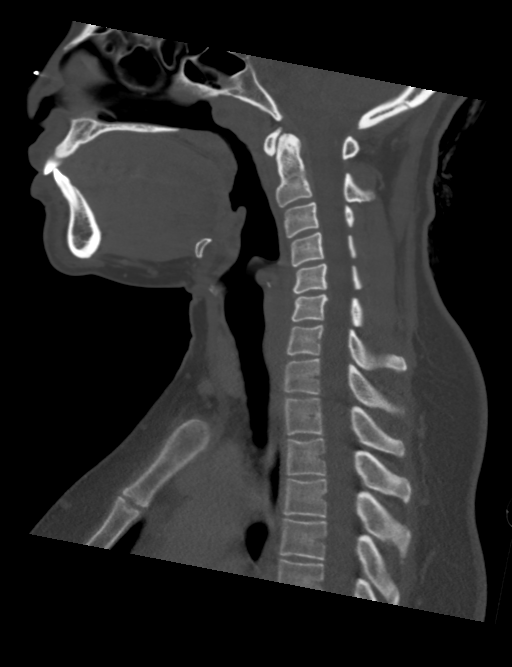
[im 47/81  bone]
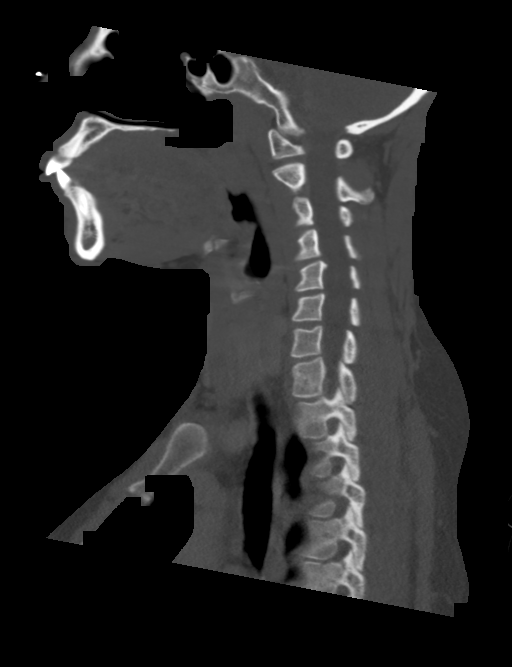
[im 54/81  bone]
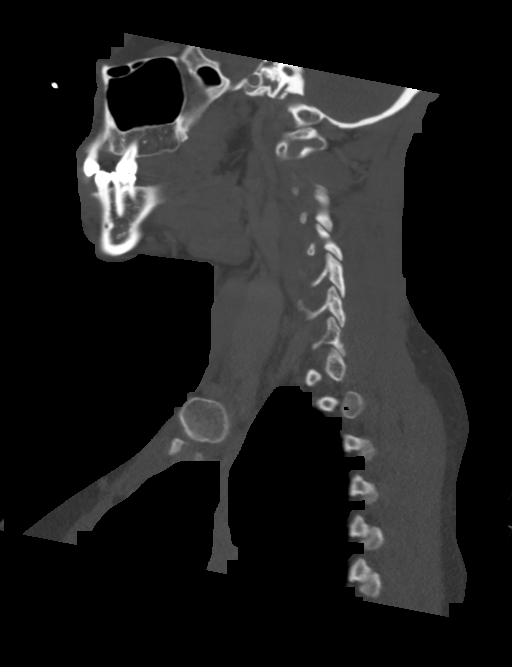

[Series 6: cor neck · coronal · 0.33mm/px · 3 of 94 slices shown]
[im 19/94  bone]
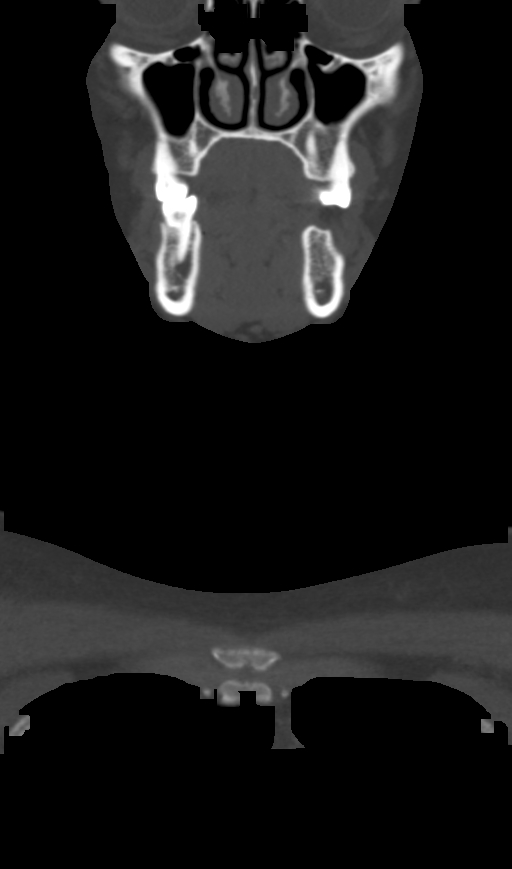
[im 38/94  bone]
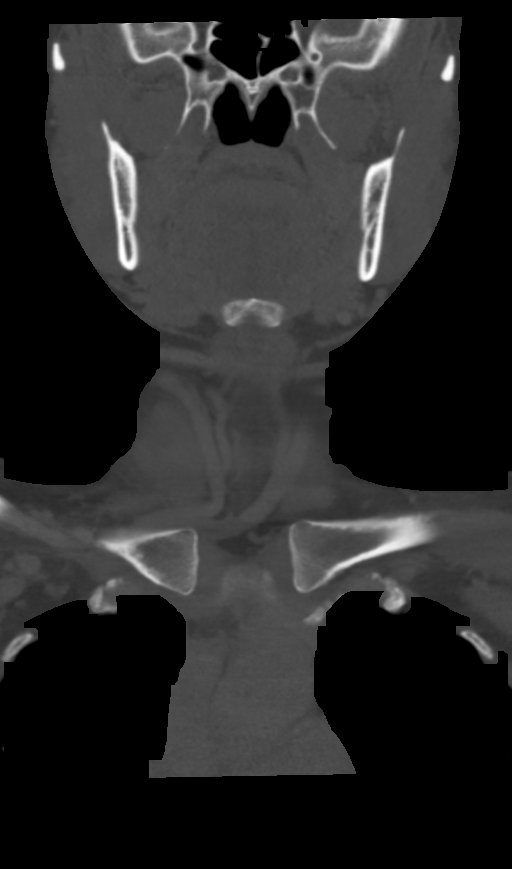
[im 56/94  bone]
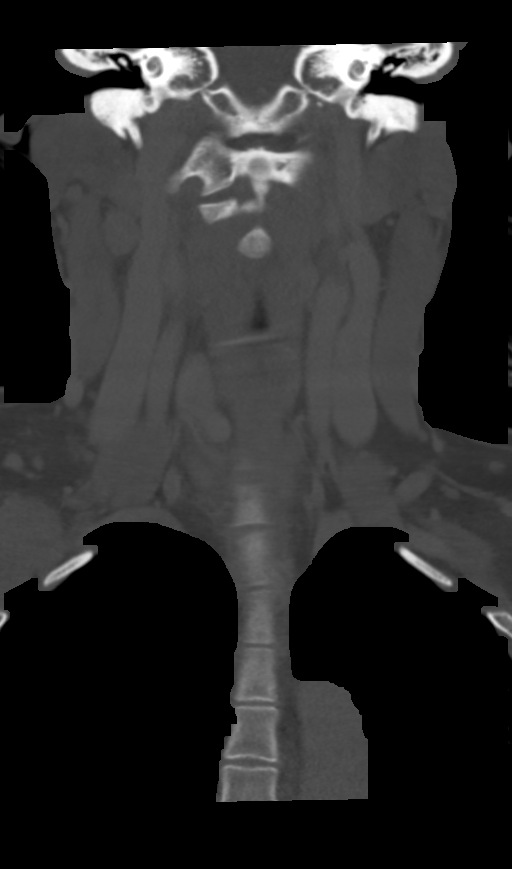

[12 of 33 positions shown; findings below may reference images not displayed]

FINDINGS: Pharynx and larynx: Mild motion artifact at the larynx which appears
to remain normal. Pharyngeal soft tissue contours are within normal
limits, there is stable chronic adenoid hypertrophy. Negative
parapharyngeal spaces. Negative retropharyngeal space.

Salivary glands: Negative sublingual space. Submandibular and
parotid glands are symmetric and within normal limits.

Thyroid: Asymmetrically enlarged right thyroid lobe has not
significantly changed. No discrete thyroid lesion by CT.

Lymph nodes: Since 8294. The number of lymph nodes appears stable,
but all nodes have increased in size ranging from 6-10 millimeters
short axis today, where as all were sub centimeter in 8294. The
level 1 and level 4 nodes are spared. No cystic, necrotic, or
heterogeneous nodes.

The left side lymph nodes are stable and within normal limits.

There is no associated discrete soft tissue inflammation in the
right neck.

Vascular: Major vascular structures in the neck and at the skull
base are patent including the right IJ.

Limited intracranial: Negative.

Visualized orbits: Negative.

Mastoids and visualized paranasal sinuses: Mild mucosal thickening
and bubbly opacity in a portion of the left sphenoid sinus, but the
remaining visible paranasal sinuses, tympanic cavities, and mastoids
are clear.

Skeleton: No acute dental abnormality. Negative visible other
osseous structures.

Upper chest: Negative visible upper chest. Upper lungs are clear.
Normal visible mediastinum. Normal visible axillary lymph nodes.
IMPRESSION: Enlarged, reactive appearing right side lymph nodes at the level 2,
level 3 and level 5 stations.
No cystic or necrotic nodes. No associated neck mass or focal
inflammatory process in the neck.
The contralateral left side and other nodal stations are stable
since 8294 and normal.
Negative visible upper chest.

The abnormal right lymph nodes are presumed to be inflammatory, but
if they fail to resolve as expected then an ultrasound-guided needle
biopsy of a dominant node would be recommended.

## 2021-04-19 IMAGING — DX DG CHEST 1V
1 series · 1 of 1 positions shown · non-contrast
Comparison: None.

CLINICAL DATA: RIGHT-sided chest pain

EXAM:
CHEST  1 VIEW

[chest ap]
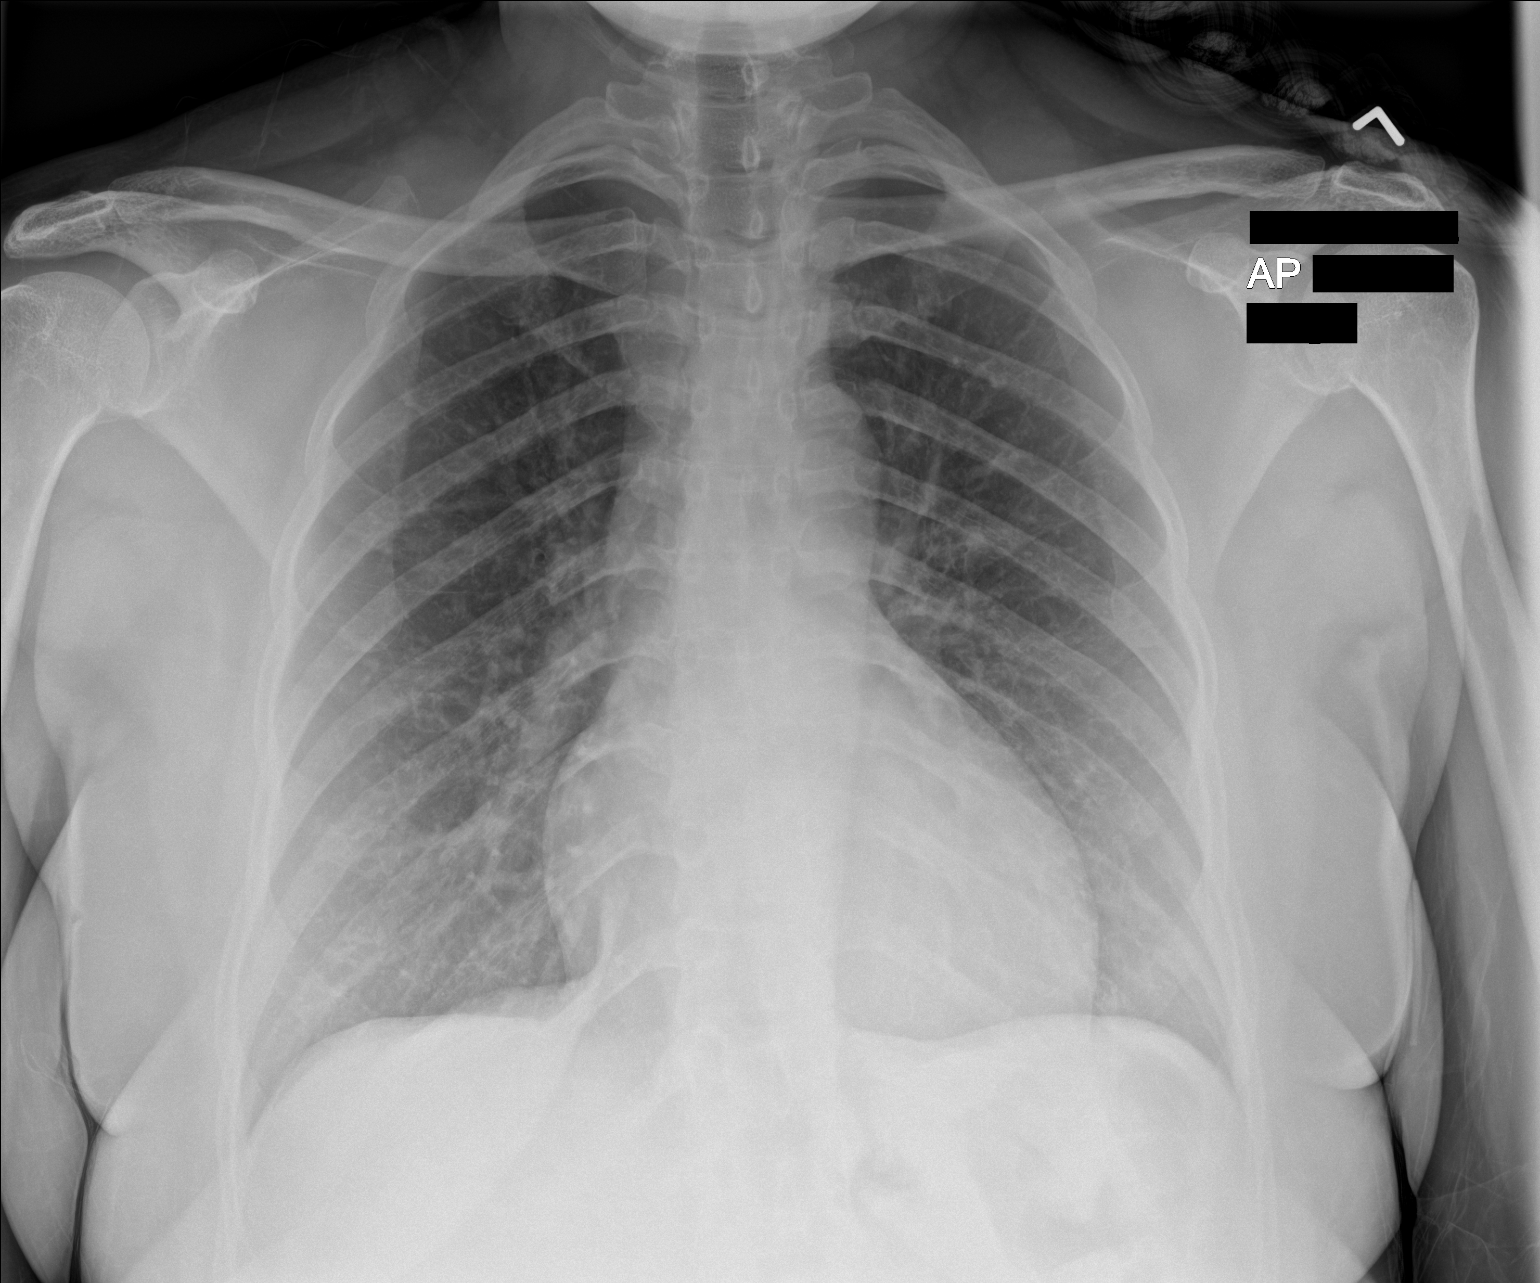

[1 of 1 positions shown; findings below may reference images not displayed]

FINDINGS: Normal mediastinum and cardiac silhouette. Normal pulmonary
vasculature. No evidence of effusion, infiltrate, or pneumothorax.
No acute bony abnormality.
IMPRESSION: No acute cardiopulmonary process.

## 2022-02-14 DIAGNOSIS — H5350 Unspecified color vision deficiencies: Secondary | ICD-10-CM | POA: Insufficient documentation

## 2022-05-31 ENCOUNTER — Other Ambulatory Visit: Payer: Self-pay

## 2022-05-31 ENCOUNTER — Emergency Department
Admission: EM | Admit: 2022-05-31 | Discharge: 2022-05-31 | Disposition: A | Discharge status: Home / Self Care | Payer: Medicaid Other | Attending: Student in an Organized Health Care Education/Training Program | Admitting: Student in an Organized Health Care Education/Training Program

## 2022-05-31 ENCOUNTER — Encounter: Payer: Self-pay | Admitting: Emergency Medicine

## 2022-05-31 DIAGNOSIS — R1013 Epigastric pain: Secondary | ICD-10-CM | POA: Insufficient documentation

## 2022-05-31 DIAGNOSIS — R112 Nausea with vomiting, unspecified: Secondary | ICD-10-CM | POA: Diagnosis present

## 2022-05-31 LAB — URINALYSIS, ROUTINE W REFLEX MICROSCOPIC
Bilirubin Urine: NEGATIVE
Glucose, UA: NEGATIVE mg/dL
Hgb urine dipstick: NEGATIVE
Ketones, ur: 20 mg/dL — AB
Leukocytes,Ua: NEGATIVE
Nitrite: NEGATIVE
Protein, ur: 100 mg/dL — AB
Specific Gravity, Urine: 1.025 (ref 1.005–1.030)
pH: 9 — ABNORMAL HIGH (ref 5.0–8.0)

## 2022-05-31 LAB — CBC WITH DIFFERENTIAL/PLATELET
Abs Immature Granulocytes: 0.01 10*3/uL (ref 0.00–0.07)
Basophils Absolute: 0 10*3/uL (ref 0.0–0.1)
Basophils Relative: 1 %
Eosinophils Absolute: 0 10*3/uL (ref 0.0–0.5)
Eosinophils Relative: 0 %
HCT: 40.2 % (ref 36.0–46.0)
Hemoglobin: 13.1 g/dL (ref 12.0–15.0)
Immature Granulocytes: 0 %
Lymphocytes Relative: 23 %
Lymphs Abs: 1.3 10*3/uL (ref 0.7–4.0)
MCH: 26.6 pg (ref 26.0–34.0)
MCHC: 32.6 g/dL (ref 30.0–36.0)
MCV: 81.7 fL (ref 80.0–100.0)
Monocytes Absolute: 0.3 10*3/uL (ref 0.1–1.0)
Monocytes Relative: 5 %
Neutro Abs: 3.8 10*3/uL (ref 1.7–7.7)
Neutrophils Relative %: 71 %
Platelets: 401 10*3/uL — ABNORMAL HIGH (ref 150–400)
RBC: 4.92 MIL/uL (ref 3.87–5.11)
RDW: 13.5 % (ref 11.5–15.5)
WBC: 5.4 10*3/uL (ref 4.0–10.5)
nRBC: 0 % (ref 0.0–0.2)

## 2022-05-31 LAB — LIPASE, BLOOD: Lipase: 23 U/L (ref 11–51)

## 2022-05-31 LAB — COMPREHENSIVE METABOLIC PANEL
ALT: 15 U/L (ref 0–44)
AST: 28 U/L (ref 15–41)
Albumin: 4.1 g/dL (ref 3.5–5.0)
Alkaline Phosphatase: 68 U/L (ref 38–126)
Anion gap: 11 (ref 5–15)
BUN: 11 mg/dL (ref 6–20)
CO2: 23 mmol/L (ref 22–32)
Calcium: 9.5 mg/dL (ref 8.9–10.3)
Chloride: 105 mmol/L (ref 98–111)
Creatinine, Ser: 0.9 mg/dL (ref 0.44–1.00)
GFR, Estimated: >60 mL/min (ref >60)
Glucose, Bld: 118 mg/dL — ABNORMAL HIGH (ref 70–99)
Potassium: 3.8 mmol/L (ref 3.5–5.1)
Sodium: 139 mmol/L (ref 135–145)
Total Bilirubin: 0.9 mg/dL (ref 0.3–1.2)
Total Protein: 8 g/dL (ref 6.5–8.1)

## 2022-05-31 LAB — HCG, QUANTITATIVE, PREGNANCY: hCG, Beta Chain, Quant, S: 1 m[IU]/mL (ref <5)

## 2022-05-31 MED ORDER — PANTOPRAZOLE SODIUM 40 MG IV SOLR
40.0000 mg | Freq: Once | INTRAVENOUS | Status: AC
  Administered 2022-05-31: 40 mg via INTRAVENOUS
  Filled 2022-05-31: qty 10

## 2022-05-31 MED ORDER — DROPERIDOL 2.5 MG/ML IJ SOLN
2.0000 mg | Freq: Once | INTRAMUSCULAR | Status: AC
  Administered 2022-05-31: 2 mg via INTRAVENOUS
  Filled 2022-05-31: qty 2

## 2022-05-31 MED ORDER — ALUM & MAG HYDROXIDE-SIMETH 200-200-20 MG/5ML PO SUSP
30.0000 mL | Freq: Once | ORAL | Status: AC
  Administered 2022-05-31: 30 mL via ORAL
  Filled 2022-05-31: qty 30

## 2022-05-31 MED ORDER — ONDANSETRON 4 MG PO TBDP: 4.0000 mg | ORAL_TABLET | Freq: Three times a day (TID) | ORAL | 0 refills | Status: DC | PRN

## 2022-05-31 MED ORDER — SODIUM CHLORIDE 0.9 % IV BOLUS
1000.0000 mL | Freq: Once | INTRAVENOUS | Status: AC
  Administered 2022-05-31: 1000 mL via INTRAVENOUS

## 2022-05-31 MED ORDER — PROMETHAZINE HCL 25 MG RE SUPP: 25.0000 mg | Freq: Three times a day (TID) | RECTAL | 0 refills | Status: DC | PRN

## 2022-05-31 MED ORDER — LIDOCAINE VISCOUS HCL 2 % MT SOLN
15.0000 mL | Freq: Once | OROMUCOSAL | Status: AC
  Administered 2022-05-31: 15 mL via ORAL
  Filled 2022-05-31: qty 15

## 2022-05-31 MED ORDER — ONDANSETRON HCL 4 MG/2ML IJ SOLN
4.0000 mg | Freq: Once | INTRAMUSCULAR | Status: AC
  Administered 2022-05-31: 4 mg via INTRAVENOUS
  Filled 2022-05-31: qty 2

## 2022-05-31 NOTE — ED Provider Notes (Signed)
[facility] Provider Note    Event Date/Time   First MD Initiated Contact with Patient 05/31/22 1325     (approximate)   History   Abdominal Pain and Emesis   HPI  AARADHYA MCLAIN is a 41 y.o. female with a history of gastritis presents to the ER for evaluation of epigastric discomfort and nausea vomiting since last night after she had multiple alcoholic beverages.  Has had similar symptoms in the past.  Denies any chest pain or shortness of breath  Did not sleep much last night and feels worse because of this.  No pain radiating through to her back.  No pain or discomfort below the umbilicus.     Physical Exam   Triage Vital Signs: ED Triage Vitals  Enc Vitals Group     BP 05/31/22 1322 (!) 150/102     Pulse Rate 05/31/22 1322 (!) 58     Resp 05/31/22 1322 20     Temp 05/31/22 1322 97.7 F (36.5 C)     Temp Source 05/31/22 1322 Oral     SpO2 05/31/22 1323 100 %     Weight 05/31/22 1322 165 lb (74.8 kg)     Height 05/31/22 1322 5\' 6"  (1.676 m)     Head Circumference --      Peak Flow --      Pain Score 05/31/22 1322 9     Pain Loc --      Pain Edu? --      Excl. in [place]? --     Most recent vital signs: Vitals:   05/31/22 1323 05/31/22 1427  BP:  (!) 148/99  Pulse:  60  Resp:  20  Temp:    SpO2: 100% 100%     Constitutional: Alert  Eyes: Conjunctivae are normal.  Head: Atraumatic. Nose: No congestion/rhinnorhea. Mouth/Throat: Mucous membranes are moist.   Neck: Painless ROM.  Cardiovascular:   Good peripheral circulation. Respiratory: Normal respiratory effort.  No retractions.  Gastrointestinal: Soft and nontender in all four quadrants  Musculoskeletal:  no deformity Neurologic:  MAE spontaneously. No gross focal neurologic deficits are appreciated.  Skin:  Skin is warm, dry and intact. No rash noted. Psychiatric: Mood and affect are normal. Speech and behavior are normal.    ED Results / Procedures / Treatments   Labs (all  labs ordered are listed, but only abnormal results are displayed) Labs Reviewed  CBC WITH DIFFERENTIAL/PLATELET - Abnormal; Notable for the following components:      Result Value   Platelets 401 (*)    All other components within normal limits  COMPREHENSIVE METABOLIC PANEL - Abnormal; Notable for the following components:   Glucose, Bld 118 (*)    All other components within normal limits  LIPASE, BLOOD  HCG, QUANTITATIVE, PREGNANCY  URINALYSIS, ROUTINE W REFLEX MICROSCOPIC  POC URINE PREG, ED     EKG     RADIOLOGY    PROCEDURES:  Critical Care performed: No  Procedures   MEDICATIONS ORDERED IN ED: Medications  droperidol (INAPSINE) 2.5 MG/ML injection 2 mg (has no administration in time range)  sodium chloride 0.9 % bolus 1,000 mL (1,000 mLs Intravenous New Bag/Given 05/31/22 1340)  pantoprazole (PROTONIX) injection 40 mg (40 mg Intravenous Given 05/31/22 1347)  alum & mag hydroxide-simeth (MAALOX/MYLANTA) 200-200-20 MG/5ML suspension 30 mL (30 mLs Oral Given 05/31/22 1348)    And  lidocaine (XYLOCAINE) 2 % viscous mouth solution 15 mL (15 mLs Oral Given 05/31/22 1348)  ondansetron (  ZOFRAN) injection 4 mg (4 mg Intravenous Given 05/31/22 1347)     IMPRESSION / MDM / ASSESSMENT AND PLAN / ED COURSE  I reviewed the triage vital signs and the nursing notes.                              Differential diagnosis includes, but is not limited to, dehydration, gastritis, enteritis, pancreatitis, biliary pathology, hepatitis, PUD, dehydration  Patient presented to the ER for evaluation symptoms as described above.  Patient tearful nontoxic benign abdominal exam.  Bladder present for the blood differential.  Will provide IV fluids as well as antiemetic GI cocktail and Protonix.  Clinical Course as of 05/31/22 1519  Tue May 31, 2022  1511 Patient ambulating in no acute distress.  Still complaining of severe nausea.  Admit to using marijuana last night.  hCG is negative we will  give droperidol. [PR]  [id number] Patient will be signed out to oncoming physician pending reassessment.  Anticipate discharge home. [PR]    Clinical Course User Index [PR] Merlyn Lot, MD      FINAL CLINICAL IMPRESSION(S) / ED DIAGNOSES   Final diagnoses:  Nausea and vomiting, unspecified vomiting type     Rx / DC Orders   ED Discharge Orders          Ordered    ondansetron (ZOFRAN-ODT) 4 MG disintegrating tablet  Every 8 hours PRN        05/31/22 1513             Note:  This document was prepared using Dragon voice recognition software and may include unintentional dictation errors.    Merlyn Lot, MD 05/31/22 [phone]
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
## 2022-05-31 NOTE — ED Provider Notes (Signed)
Patient feels much better and is tolerating PO without difficulty. Suspect possible cyclical vomiting vs CHS. Will d/c with supportive care, antiemetics, return precautions. Abdomen soft, NT, ND, no leukocytosis, no signs to suggest intra-abd emergency.   Duffy Bruce, MD 05/31/22 Jeri Lager

## 2023-08-31 ENCOUNTER — Other Ambulatory Visit: Payer: Self-pay | Admitting: Family Medicine

## 2023-08-31 DIAGNOSIS — Z1231 Encounter for screening mammogram for malignant neoplasm of breast: Secondary | ICD-10-CM

## 2023-12-13 ENCOUNTER — Emergency Department
Admission: EM | Admit: 2023-12-13 | Discharge: 2023-12-13 | Disposition: A | Discharge status: Home / Self Care | Payer: Medicaid Other | Attending: Emergency Medicine | Admitting: Emergency Medicine

## 2023-12-13 ENCOUNTER — Other Ambulatory Visit: Payer: Self-pay

## 2023-12-13 DIAGNOSIS — T310 Burns involving less than 10% of body surface: Secondary | ICD-10-CM | POA: Insufficient documentation

## 2023-12-13 DIAGNOSIS — T23272A Burn of second degree of left wrist, initial encounter: Secondary | ICD-10-CM | POA: Insufficient documentation

## 2023-12-13 DIAGNOSIS — X102XXA Contact with fats and cooking oils, initial encounter: Secondary | ICD-10-CM | POA: Diagnosis not present

## 2023-12-13 DIAGNOSIS — T23072A Burn of unspecified degree of left wrist, initial encounter: Secondary | ICD-10-CM | POA: Diagnosis present

## 2023-12-13 MED ORDER — SILVER SULFADIAZINE 1 % EX CREA: TOPICAL_CREAM | CUTANEOUS | 1 refills | Status: DC

## 2023-12-13 MED ORDER — SILVER SULFADIAZINE 1 % EX CREA
TOPICAL_CREAM | Freq: Once | CUTANEOUS | Status: AC
  Administered 2023-12-13: 1 via TOPICAL
  Filled 2023-12-13: qty 20
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
## 2024-02-20 ENCOUNTER — Other Ambulatory Visit: Payer: Self-pay | Admitting: Family Medicine

## 2024-02-20 DIAGNOSIS — Z1231 Encounter for screening mammogram for malignant neoplasm of breast: Secondary | ICD-10-CM

## 2024-04-03 ENCOUNTER — Encounter

## 2024-05-23 DIAGNOSIS — M20011 Mallet finger of right finger(s): Secondary | ICD-10-CM | POA: Insufficient documentation

## 2024-07-02 ENCOUNTER — Other Ambulatory Visit: Payer: Self-pay

## 2024-07-02 ENCOUNTER — Telehealth: Payer: Self-pay

## 2024-07-02 DIAGNOSIS — Z1211 Encounter for screening for malignant neoplasm of colon: Secondary | ICD-10-CM

## 2024-07-02 MED ORDER — GOLYTELY 236 G PO SOLR: 4000.0000 mL | Freq: Once | ORAL | 0 refills | Status: AC

## 2024-07-02 NOTE — Telephone Encounter (Signed)
 Gastroenterology Pre-Procedure Review  Request Date: 10/01/24 Requesting Physician: Dr. Jinny  PATIENT REVIEW QUESTIONS: The patient responded to the following health history questions as indicated:    1. Are you having any GI issues? no 2. Do you have a personal history of Polyps? no 3. Do you have a family history of Colon Cancer or Polyps? no 4. Diabetes Mellitus? no 5. Joint replacements in the past 12 months?no she had pinky fusion on her right hand a few weeks ago 6. Major health problems in the past 3 months?no 7. Any artificial heart valves, MVP, or defibrillator?no    MEDICATIONS & ALLERGIES:    Patient reports the following regarding taking any anticoagulation/antiplatelet therapy:   Plavix, Coumadin, Eliquis, Xarelto, Lovenox, Pradaxa, Brilinta, or Effient? no Aspirin? no  Patient confirms/reports the following medications:  Current Outpatient Medications  Medication Sig Dispense Refill   Lanolin-Petrolatum  15.5-53.4 % OINT Apply 1 application topically as needed. Up to 6 times/day 60 g 1   levothyroxine  (SYNTHROID , LEVOTHROID) 25 MCG tablet Take 25 mcg by mouth daily before breakfast.     medroxyPROGESTERone (DEPO-SUBQ PROVERA 104) 104 MG/0.65ML injection Inject 104 mg into the skin every 3 (three) months.     Multiple Vitamin (MULTIVITAMIN WITH MINERALS) TABS tablet Take 1 tablet by mouth daily.     ondansetron  (ZOFRAN -ODT) 4 MG disintegrating tablet Take 1 tablet (4 mg total) by mouth every 8 (eight) hours as needed for nausea or vomiting. 8 tablet 0   promethazine  (PHENERGAN ) 25 MG suppository Place 1 suppository (25 mg total) rectally every 8 (eight) hours as needed for refractory nausea / vomiting. 12 each 0   silver  sulfADIAZINE  (SILVADENE ) 1 % cream Apply to affected area daily 50 g 1   No current facility-administered medications for this visit.    Patient confirms/reports the following allergies:  No Known Allergies  No orders of the defined types were placed  in this encounter.   AUTHORIZATION INFORMATION Primary Insurance: 1D#: Group #:  Secondary Insurance: 1D#: Group #:  SCHEDULE INFORMATION: Date: 10/01/24 Time: Location: [facility]

## 2024-08-28 ENCOUNTER — Ambulatory Visit
Admission: RE | Admit: 2024-08-28 | Discharge: 2024-08-28 | Disposition: A | Discharge status: Home / Self Care | Source: Ambulatory Visit | Attending: Family Medicine | Admitting: Family Medicine

## 2024-08-28 DIAGNOSIS — Z1231 Encounter for screening mammogram for malignant neoplasm of breast: Secondary | ICD-10-CM | POA: Insufficient documentation

## 2024-09-24 ENCOUNTER — Encounter: Payer: Self-pay | Admitting: Gastroenterology

## 2024-09-30 ENCOUNTER — Encounter: Payer: Self-pay | Admitting: Gastroenterology

## 2024-10-01 ENCOUNTER — Ambulatory Visit: Admitting: Anesthesiology

## 2024-10-01 ENCOUNTER — Encounter: Payer: Self-pay | Admitting: Gastroenterology

## 2024-10-01 ENCOUNTER — Other Ambulatory Visit: Payer: Self-pay

## 2024-10-01 ENCOUNTER — Encounter
Admission: RE | Disposition: A | Discharge status: Home / Self Care | Payer: Self-pay | Source: Home / Self Care | Attending: Gastroenterology

## 2024-10-01 ENCOUNTER — Ambulatory Visit
Admission: RE | Admit: 2024-10-01 | Discharge: 2024-10-01 | Disposition: A | Discharge status: Home / Self Care | Attending: Gastroenterology | Admitting: Gastroenterology

## 2024-10-01 DIAGNOSIS — K648 Other hemorrhoids: Secondary | ICD-10-CM | POA: Diagnosis not present

## 2024-10-01 DIAGNOSIS — Z1211 Encounter for screening for malignant neoplasm of colon: Secondary | ICD-10-CM | POA: Diagnosis not present

## 2024-10-01 DIAGNOSIS — K640 First degree hemorrhoids: Secondary | ICD-10-CM | POA: Insufficient documentation

## 2024-10-01 DIAGNOSIS — Z87891 Personal history of nicotine dependence: Secondary | ICD-10-CM | POA: Diagnosis not present

## 2024-10-01 DIAGNOSIS — E039 Hypothyroidism, unspecified: Secondary | ICD-10-CM | POA: Diagnosis not present

## 2024-10-01 DIAGNOSIS — K635 Polyp of colon: Secondary | ICD-10-CM | POA: Insufficient documentation

## 2024-10-01 HISTORY — PX: POLYPECTOMY: SHX149

## 2024-10-01 HISTORY — PX: COLONOSCOPY: SHX5424

## 2024-10-01 HISTORY — DX: Asymptomatic human immunodeficiency virus (hiv) infection status: Z21

## 2024-10-01 LAB — POCT PREGNANCY, URINE: Preg Test, Ur: NEGATIVE

## 2024-10-01 SURGERY — COLONOSCOPY
Anesthesia: General

## 2024-10-01 MED ORDER — PROPOFOL 500 MG/50ML IV EMUL
INTRAVENOUS | Status: DC | PRN
  Administered 2024-10-01: 75 ug/kg/min via INTRAVENOUS

## 2024-10-01 MED ORDER — SODIUM CHLORIDE 0.9 % IV SOLN: INTRAVENOUS | Status: DC

## 2024-10-01 MED ORDER — LIDOCAINE HCL (CARDIAC) PF 100 MG/5ML IV SOSY
PREFILLED_SYRINGE | INTRAVENOUS | Status: DC | PRN
Start: 2024-10-01 — End: 2024-10-01
  Administered 2024-10-01: 60 mg via INTRAVENOUS

## 2024-10-01 MED ORDER — DEXMEDETOMIDINE HCL IN NACL 80 MCG/20ML IV SOLN
INTRAVENOUS | Status: DC | PRN
  Administered 2024-10-01: 8 ug via INTRAVENOUS
  Administered 2024-10-01: 12 ug via INTRAVENOUS

## 2024-10-01 MED ORDER — PROPOFOL 1000 MG/100ML IV EMUL
INTRAVENOUS | Status: AC
  Filled 2024-10-01: qty 100

## 2024-10-01 MED ORDER — PROPOFOL 10 MG/ML IV BOLUS
INTRAVENOUS | Status: DC | PRN
  Administered 2024-10-01: 30 mg via INTRAVENOUS
  Administered 2024-10-01: 50 mg via INTRAVENOUS

## 2024-10-01 MED ORDER — LIDOCAINE HCL (PF) 2 % IJ SOLN
INTRAMUSCULAR | Status: AC
  Filled 2024-10-01: qty 5

## 2024-10-01 NOTE — H&P (Signed)
 Becky Copping, MD [facility] [address] [place], [place] [address] Phone: [phone] Fax : [phone]  Primary Care Physician:  Osa Geralds, NP Primary Gastroenterologist:  Dr. Copping  Pre-Procedure History & Physical: HPI:  Becky Manning is a 43 y.o. female is here for a screening colonoscopy.   Past Medical History:  Diagnosis Date   Anemia    H/O   GERD (gastroesophageal reflux disease)    HIV (human immunodeficiency virus infection) (HCC)    Hypothyroidism     Past Surgical History:  Procedure Laterality Date   KATRINA OSTEOTOMY Right 09/11/2015   Procedure: KATRINA OSTEOTOMY;  Surgeon: Krystal Rosella, MD;  Location: [facility] ORS;  Service: Podiatry;  Laterality: Right;   CESAREAN SECTION N/A    X 3   ECTOPIC PREGNANCY SURGERY Left    FOOT SURGERY     HALLUX VALGUS AUSTIN Left 05/29/2015   Procedure: Hallux valgus correction, left foot ;  Surgeon: Krystal Rosella, MD;  Location: [facility] ORS;  Service: Podiatry;  Laterality: Left;   HALLUX VALGUS AUSTIN Right 09/11/2015   Procedure: HALLUX VALGUS AUSTIN;  Surgeon: Krystal Rosella, MD;  Location: [facility] ORS;  Service: Podiatry;  Laterality: Right;   IUD REMOVAL      Prior to Admission medications   Medication Sig Start Date End Date Taking? Authorizing Provider  BIKTARVY 50-200-25 MG TABS tablet Take 1 tablet by mouth daily.   Yes [provider]  levothyroxine  (SYNTHROID , LEVOTHROID) 25 MCG tablet Take 25 mcg by mouth daily before breakfast.   Yes [provider]    Allergies as of 07/03/2024   (No Known Allergies)    Family History  Problem Relation Age of Onset   Hypertension Mother    Hypercholesterolemia Mother    Breast cancer Neg Hx     Social History   Socioeconomic History   Marital status: Single    Spouse name: Not on file   Number of children: Not on file   Years of education: Not on file   Highest education level: Not on file  Occupational History   Not on file  Tobacco Use   Smoking  status: Former    Current packs/day: 0.00    Types: Cigarettes    Quit date: 05/19/2012    Years since quitting: 12.3   Smokeless tobacco: Never  Vaping Use   Vaping status: Never Used  Substance and Sexual Activity   Alcohol use: No   Drug use: No   Sexual activity: Yes    Birth control/protection: Implant  Other Topics Concern   Not on file  Social History Narrative   Not on file   Social Drivers of Health   Financial Resource Strain: Not on file  Food Insecurity: Not on file  Transportation Needs: Not on file  Physical Activity: Not on file  Stress: Not on file  Social Connections: Not on file  Intimate Partner Violence: Not At Risk (06/04/2024)   Received from [facility]   Humiliation, Afraid, Rape, and Kick questionnaire    Within the last year, have you been afraid of your partner or ex-partner?: No    Within the last year, have you been humiliated or emotionally abused in other ways by your partner or ex-partner?: No    Within the last year, have you been kicked, hit, slapped, or otherwise physically hurt by your partner or ex-partner?: No    Within the last year, have you been raped or forced to have any kind of  sexual activity by your partner or ex-partner?: No    Review of Systems: See HPI, otherwise negative ROS  Physical Exam: BP 120/81   Pulse 77   Temp (!) 96.9 F (36.1 C) (Temporal)   Resp 16   Ht 5' 6 (1.676 m)   Wt 72.8 kg   SpO2 100%   BMI 25.92 kg/m  General:   Alert,  pleasant and cooperative in NAD Head:  Normocephalic and atraumatic. Neck:  Supple; no masses or thyromegaly. Lungs:  Clear throughout to auscultation.    Heart:  Regular rate and rhythm. Abdomen:  Soft, nontender and nondistended. Normal bowel sounds, without guarding, and without rebound.   Neurologic:  Alert and  oriented x4;  grossly normal neurologically.  Impression/Plan: Becky Manning is now here to undergo a screening colonoscopy.  Risks, benefits, and  alternatives regarding colonoscopy have been reviewed with the patient.  Questions have been answered.  All parties agreeable.

## 2024-10-01 NOTE — Anesthesia Preprocedure Evaluation (Signed)
 Anesthesia Evaluation  Patient identified by MRN, date of birth, ID band Patient awake    Reviewed: Allergy & Precautions, NPO status , Patient's Chart, lab work & pertinent test results  Airway Mallampati: II  TM Distance: >3 FB Neck ROM: full    Dental  (+) Teeth Intact   Pulmonary neg pulmonary ROS, Patient abstained from smoking., former smoker   Pulmonary exam normal        Cardiovascular Exercise Tolerance: Good negative cardio ROS Normal cardiovascular exam Rhythm:Regular Rate:Normal     Neuro/Psych negative neurological ROS  negative psych ROS   GI/Hepatic negative GI ROS, Neg liver ROS,GERD  ,,  Endo/Other  negative endocrine ROSHypothyroidism    Renal/GU negative Renal ROS  negative genitourinary   Musculoskeletal   Abdominal   Peds negative pediatric ROS (+)  Hematology negative hematology ROS (+) Blood dyscrasia, anemia   Anesthesia Other Findings Past Medical History: No date: Anemia     Comment:  H/O No date: GERD (gastroesophageal reflux disease) No date: HIV (human immunodeficiency virus infection) (HCC) No date: Hypothyroidism  Past Surgical History: 09/11/2015: KATRINA OSTEOTOMY; Right     Comment:  Procedure: KATRINA OSTEOTOMY;  Surgeon: Krystal Rosella, MD;                Location: [facility] ORS;  Service: Podiatry;  Laterality:               Right; No date: CESAREAN SECTION; N/A     Comment:  X 3 No date: ECTOPIC PREGNANCY SURGERY; Left No date: FOOT SURGERY 05/29/2015: HALLUX VALGUS AUSTIN; Left     Comment:  Procedure: Hallux valgus correction, left foot ;                Surgeon: Krystal Rosella, MD;  Location: [facility] ORS;  Service:               Podiatry;  Laterality: Left; 09/11/2015: HALLUX VALGUS AUSTIN; Right     Comment:  Procedure: HALLUX VALGUS AUSTIN;  Surgeon: Krystal Rosella,               MD;  Location: [facility] ORS;  Service: Podiatry;  Laterality:              Right; No date: IUD REMOVAL  BMI     Body Mass Index: 25.92 kg/m      Reproductive/Obstetrics negative OB ROS                              Anesthesia Physical Anesthesia Plan  ASA: 2  Anesthesia Plan: General   Post-op Pain Management:    Induction: Intravenous  PONV Risk Score and Plan: Propofol  infusion and TIVA  Airway Management Planned: Natural Airway and Nasal Cannula  Additional Equipment:   Intra-op Plan:   Post-operative Plan:   Informed Consent: I have reviewed the patients History and Physical, chart, labs and discussed the procedure including the risks, benefits and alternatives for the proposed anesthesia with the patient or authorized representative who has indicated his/her understanding and acceptance.     Dental Advisory Given  Plan Discussed with: CRNA  Anesthesia Plan Comments:         Anesthesia Quick Evaluation

## 2024-10-01 NOTE — Anesthesia Postprocedure Evaluation (Signed)
 Anesthesia Post Note  Patient: Becky Manning  Procedure(s) Performed: COLONOSCOPY POLYPECTOMY, INTESTINE  Patient location during evaluation: PACU Anesthesia Type: General Level of consciousness: awake Pain management: satisfactory to patient Vital Signs Assessment: post-procedure vital signs reviewed and stable Respiratory status: spontaneous breathing and nonlabored ventilation Cardiovascular status: stable Anesthetic complications: no   No notable events documented.   Last Vitals:  Vitals:   10/01/24 1237 10/01/24 1242  BP:  121/79  Pulse:  65  Resp: 18 18  Temp:    SpO2:  100%    Last Pain:  Vitals:   10/01/24 1242  TempSrc:   PainSc: 0-No pain                 VAN STAVEREN,Tanyiah Laurich

## 2024-10-01 NOTE — Op Note (Signed)
 [facility] Patient Name: Becky Manning Procedure Date: 10/01/2024 11:50 AM MRN: [id number] Account #: [id number] Date of Birth: February 20, 1981 Admit Type: Outpatient Age: 43 Room: [facility] ENDO ROOM 4 Gender: Female Note Status: Finalized Instrument Name: Colon Scope [id number] Procedure:             Colonoscopy Indications:           Screening for colorectal malignant neoplasm Providers:             [name] MD, MD Medicines:             Propofol  per Anesthesia Complications:         No immediate complications. Procedure:             Pre-Anesthesia Assessment:                        - Prior to the procedure, a History and Physical was                         performed, and patient medications and allergies were                         reviewed. The patient's tolerance of previous                         anesthesia was also reviewed. The risks and benefits                         of the procedure and the sedation options and risks                         were discussed with the patient. All questions were                         answered, and informed consent was obtained. Prior                         Anticoagulants: The patient has taken no anticoagulant                         or antiplatelet agents. ASA Grade Assessment: II - A                         patient with mild systemic disease. After reviewing                         the risks and benefits, the patient was deemed in                         satisfactory condition to undergo the procedure.                        After obtaining informed consent, the colonoscope was                         passed under direct vision. Throughout the procedure,                         the patient's  blood pressure, pulse, and oxygen                         saturations were monitored continuously. The                         Colonoscope was introduced through the anus and                         advanced to the the  cecum, identified by appendiceal                         orifice and ileocecal valve. The colonoscopy was                         performed without difficulty. The patient tolerated                         the procedure well. The quality of the bowel                         preparation was excellent. Findings:      The perianal and digital rectal examinations were normal.      A 3 mm polyp was found in the sigmoid colon. The polyp was sessile. The       polyp was removed with a cold snare. Resection and retrieval were       complete.      Non-bleeding internal hemorrhoids were found during retroflexion. The       hemorrhoids were Grade I (internal hemorrhoids that do not prolapse). Impression:            - One 3 mm polyp in the sigmoid colon, removed with a                         cold snare. Resected and retrieved.                        - Non-bleeding internal hemorrhoids. Recommendation:        - Discharge patient to home.                        - Resume previous diet.                        - Continue present medications.                        - If the pathology report reveals adenomatous tissue,                         then repeat the colonoscopy for surveillance in 7                         years. Procedure Code(s):     --- Professional ---                        [phone], Colonoscopy, flexible; with removal of  tumor(s), polyp(s), or other lesion(s) by snare                         technique Diagnosis Code(s):     --- Professional ---                        Z12.11, Encounter for screening for malignant neoplasm                         of colon                        D12.5, Benign neoplasm of sigmoid colon CPT copyright 2022 [employer]. All rights reserved. The codes documented in this report are preliminary and upon coder review may  be revised to meet current compliance requirements. [name] MD, MD 10/01/2024 12:14:51 PM This report  has been signed electronically. Number of Addenda: 0 Note Initiated On: 10/01/2024 11:50 AM Scope Withdrawal Time: 0 hours 11 minutes 43 seconds  Total Procedure Duration: 0 hours 14 minutes 29 seconds  Estimated Blood Loss:  Estimated blood loss: none.      [facility]

## 2024-10-01 NOTE — Transfer of Care (Signed)
 Immediate Anesthesia Transfer of Care Note  Patient: Becky Manning  Procedure(s) Performed: COLONOSCOPY POLYPECTOMY, INTESTINE  Patient Location: PACU  Anesthesia Type:General  Level of Consciousness: sedated  Airway & Oxygen Therapy: Patient Spontanous Breathing  Post-op Assessment: Report given to RN and Post -op Vital signs reviewed and stable  Post vital signs: Reviewed and stable  Last Vitals:  Vitals Value Taken Time  BP 94/66 10/01/24 12:17  Temp    Pulse 74 10/01/24 12:17  Resp 17 10/01/24 12:17  SpO2 100 % 10/01/24 12:17  Vitals shown include unfiled device data.  Last Pain:  Vitals:   10/01/24 1019  TempSrc: Temporal  PainSc: 0-No pain         Complications: No notable events documented.

## 2024-10-03 ENCOUNTER — Ambulatory Visit: Payer: Self-pay | Admitting: Gastroenterology

## 2024-10-03 LAB — SURGICAL PATHOLOGY

## 2024-11-21 ENCOUNTER — Other Ambulatory Visit: Payer: Self-pay | Admitting: Nurse Practitioner

## 2024-11-21 DIAGNOSIS — E042 Nontoxic multinodular goiter: Secondary | ICD-10-CM

## 2024-12-25 ENCOUNTER — Ambulatory Visit
Admission: RE | Admit: 2024-12-25 | Discharge: 2024-12-25 | Disposition: A | Discharge status: Home / Self Care | Source: Ambulatory Visit | Attending: Nurse Practitioner | Admitting: Nurse Practitioner

## 2024-12-25 DIAGNOSIS — E042 Nontoxic multinodular goiter: Secondary | ICD-10-CM | POA: Diagnosis present
